# Patient Record
Sex: Female | Born: 1950 | Race: White | Hispanic: No | Marital: Married | Smoking: Former smoker
Health system: Southern US, Community
[De-identification: ages and names within clinical notes are randomized; demographics above are authoritative.]

## PROBLEM LIST (undated history)

## (undated) DIAGNOSIS — J449 Chronic obstructive pulmonary disease, unspecified: Secondary | ICD-10-CM

## (undated) DIAGNOSIS — G43909 Migraine, unspecified, not intractable, without status migrainosus: Secondary | ICD-10-CM

## (undated) DIAGNOSIS — F329 Major depressive disorder, single episode, unspecified: Secondary | ICD-10-CM

## (undated) DIAGNOSIS — T7840XA Allergy, unspecified, initial encounter: Secondary | ICD-10-CM

## (undated) DIAGNOSIS — G4730 Sleep apnea, unspecified: Secondary | ICD-10-CM

## (undated) DIAGNOSIS — M1990 Unspecified osteoarthritis, unspecified site: Secondary | ICD-10-CM

## (undated) DIAGNOSIS — F32A Depression, unspecified: Secondary | ICD-10-CM

## (undated) DIAGNOSIS — J189 Pneumonia, unspecified organism: Secondary | ICD-10-CM

## (undated) DIAGNOSIS — I10 Essential (primary) hypertension: Secondary | ICD-10-CM

## (undated) HISTORY — DX: Unspecified osteoarthritis, unspecified site: M19.90

## (undated) HISTORY — DX: Migraine, unspecified, not intractable, without status migrainosus: G43.909

## (undated) HISTORY — DX: Depression, unspecified: F32.A

## (undated) HISTORY — PX: DILATION AND CURETTAGE OF UTERUS: SHX78

## (undated) HISTORY — DX: Allergy, unspecified, initial encounter: T78.40XA

## (undated) HISTORY — DX: Sleep apnea, unspecified: G47.30

## (undated) HISTORY — DX: Major depressive disorder, single episode, unspecified: F32.9

## (undated) HISTORY — DX: Pneumonia, unspecified organism: J18.9

## (undated) HISTORY — PX: APPENDECTOMY: SHX54

## (undated) HISTORY — PX: ABDOMINAL HYSTERECTOMY: SHX81

## (undated) HISTORY — DX: Essential (primary) hypertension: I10

## (undated) HISTORY — DX: Chronic obstructive pulmonary disease, unspecified: J44.9

---

## 1973-06-06 HISTORY — PX: OVARY SURGERY: SHX727

## 2017-02-16 ENCOUNTER — Emergency Department (HOSPITAL_BASED_OUTPATIENT_CLINIC_OR_DEPARTMENT_OTHER)
Admission: EM | Admit: 2017-02-16 | Discharge: 2017-02-16 | Disposition: A | Discharge status: Home / Self Care | Attending: Emergency Medicine | Admitting: Emergency Medicine

## 2017-02-16 ENCOUNTER — Emergency Department (HOSPITAL_BASED_OUTPATIENT_CLINIC_OR_DEPARTMENT_OTHER)

## 2017-02-16 ENCOUNTER — Encounter (HOSPITAL_BASED_OUTPATIENT_CLINIC_OR_DEPARTMENT_OTHER): Payer: Self-pay

## 2017-02-16 DIAGNOSIS — Y999 Unspecified external cause status: Secondary | ICD-10-CM | POA: Insufficient documentation

## 2017-02-16 DIAGNOSIS — W228XXA Striking against or struck by other objects, initial encounter: Secondary | ICD-10-CM | POA: Insufficient documentation

## 2017-02-16 DIAGNOSIS — S6991XA Unspecified injury of right wrist, hand and finger(s), initial encounter: Secondary | ICD-10-CM | POA: Diagnosis present

## 2017-02-16 DIAGNOSIS — Y929 Unspecified place or not applicable: Secondary | ICD-10-CM | POA: Diagnosis not present

## 2017-02-16 DIAGNOSIS — Y939 Activity, unspecified: Secondary | ICD-10-CM | POA: Diagnosis not present

## 2017-02-16 DIAGNOSIS — S60221A Contusion of right hand, initial encounter: Secondary | ICD-10-CM | POA: Diagnosis not present

## 2017-02-16 NOTE — ED Triage Notes (Signed)
Pt reports she was putting mail in [place] yesterday and door came down and smashed her right hand; c/o 5/10 pain. Tetanus shot was 5 years ago.

## 2017-02-16 NOTE — ED Provider Notes (Signed)
[facility] Provider Note   CSN: [id number] Arrival date & time: 02/16/17  0854     History   Chief Complaint Chief Complaint  Patient presents with  . Hand Injury    right    HPI Shelly Howell is a 67 y.o. female. Chief complaint hand pain  HPI:  67 year old female. Worse for the [employer]. Had her hand on a  of a rolling shelf. The upper shelf slid down and struck her on the right side of her hand. Her hand was in a closed "gripping" position.  Has small break in the skin in the mid right second metacarpal soft tissue swelling and pain with movement. She presents here  History reviewed. No pertinent past medical history.  There are no active problems to display for this patient.   Past Surgical History:  Procedure Laterality Date  . ABDOMINAL HYSTERECTOMY    . ABDOMINAL SURGERY    . APPENDECTOMY    . DILATION AND CURETTAGE OF UTERUS      OB History    No data available       Home Medications    Prior to Admission medications   Not on File    Family History No family history on file.  Social History Social History   Tobacco Use  . Smoking status: Not on file  Substance Use Topics  . Alcohol use: Not on file  . Drug use: Not on file     Allergies   Patient has no known allergies.   Review of Systems Review of Systems  Constitutional: Negative for appetite change, chills, diaphoresis, fatigue and fever.  HENT: Negative for mouth sores, sore throat and trouble swallowing.   Eyes: Negative for visual disturbance.  Respiratory: Negative for cough, chest tightness, shortness of breath and wheezing.   Cardiovascular: Negative for chest pain.  Gastrointestinal: Negative for abdominal distention, abdominal pain, diarrhea, nausea and vomiting.  Endocrine: Negative for polydipsia, polyphagia and polyuria.  Genitourinary: Negative for dysuria, frequency and hematuria.  Musculoskeletal: Positive for arthralgias.  Negative for gait problem.       Right hand pain and swelling.  Skin: Negative for color change, pallor and rash.  Neurological: Negative for dizziness, syncope, light-headedness and headaches.  Hematological: Does not bruise/bleed easily.  Psychiatric/Behavioral: Negative for behavioral problems and confusion.     Physical Exam Updated Vital Signs BP (!) 167/82 (BP Location: Right Arm)   Pulse 72   Temp 97.9 F (36.6 C) (Oral)   Resp 16   Ht 5\' 3"  (1.6 m)   Wt 95.3 kg (210 lb)   SpO2 96%   BMI 37.20 kg/m   Physical Exam  Musculoskeletal:  Tenderness in the mid right second metacarpal with some surrounding ecchymosis and soft tissue swelling per she can make a fist although it is painful. Range of motion of the thumb as well as her middle, ring, and small finger. Small break in the skin but no full-thickness laceration noted. Normal feeling and capillary refill distally     ED Treatments / Results  Labs (all labs ordered are listed, but only abnormal results are displayed) Labs Reviewed - No data to display  EKG  EKG Interpretation None       Radiology Dg Hand Complete Right  Result Date: 02/16/2017 CLINICAL DATA:  Hand injury EXAM: RIGHT HAND - COMPLETE 3+ VIEW COMPARISON:  None. FINDINGS: There is no evidence of fracture or dislocation. There is no evidence of arthropathy or other  focal bone abnormality. Soft tissues are unremarkable. IMPRESSION: Negative. Electronically Signed   By: Franchot Gallo M.D.   On: 02/16/2017 09:53    Procedures Procedures (including critical care time)  Medications Ordered in ED Medications - No data to display   Initial Impression / Assessment and Plan / ED Course  I have reviewed the triage vital signs and the nursing notes.  Pertinent labs & imaging results that were available during my care of the patient were reviewed by me and considered in my medical decision making (see chart for details).   x-rays negative. A strep  applied. 72 hours of minimizing weight bearing. Occupational health through [facility] if not improving.  Final Clinical Impressions(s) / ED Diagnoses   Final diagnoses:  Contusion of right hand, initial encounter    ED Discharge Orders    None       Tanna Furry, MD 02/16/17 1030

## 2017-02-16 NOTE — Discharge Instructions (Signed)
Ace wrap to limit motion, and swelling. Apply ice as needed. Take Motrin or Tylenol for pain. Follow-up with your employer's occupational health if continued symptoms after 72 hours.

## 2017-05-29 DIAGNOSIS — J219 Acute bronchiolitis, unspecified: Secondary | ICD-10-CM | POA: Diagnosis not present

## 2017-08-31 ENCOUNTER — Ambulatory Visit (INDEPENDENT_AMBULATORY_CARE_PROVIDER_SITE_OTHER): Payer: Federal, State, Local not specified - PPO | Admitting: Family Medicine

## 2017-08-31 ENCOUNTER — Encounter: Payer: Self-pay | Admitting: Family Medicine

## 2017-08-31 VITALS — BP 128/78 | HR 57 | Temp 98.6°F | Ht 64.0 in | Wt 215.1 lb

## 2017-08-31 DIAGNOSIS — G8929 Other chronic pain: Secondary | ICD-10-CM | POA: Diagnosis not present

## 2017-08-31 DIAGNOSIS — R053 Chronic cough: Secondary | ICD-10-CM | POA: Insufficient documentation

## 2017-08-31 DIAGNOSIS — M545 Low back pain, unspecified: Secondary | ICD-10-CM

## 2017-08-31 DIAGNOSIS — Z1239 Encounter for other screening for malignant neoplasm of breast: Secondary | ICD-10-CM

## 2017-08-31 DIAGNOSIS — Z23 Encounter for immunization: Secondary | ICD-10-CM

## 2017-08-31 DIAGNOSIS — R05 Cough: Secondary | ICD-10-CM | POA: Diagnosis not present

## 2017-08-31 DIAGNOSIS — Z1231 Encounter for screening mammogram for malignant neoplasm of breast: Secondary | ICD-10-CM

## 2017-08-31 MED ORDER — ALBUTEROL SULFATE 108 (90 BASE) MCG/ACT IN AEPB: 1.0000 | INHALATION_SPRAY | Freq: Four times a day (QID) | RESPIRATORY_TRACT | 1 refills | Status: DC | PRN

## 2017-08-31 NOTE — Patient Instructions (Signed)
Heat (pad or rice pillow in microwave) over affected area, 10-15 minutes twice daily.   Try the inhaler around 10-15 minutes before you know you are going to be exposed to things or right when you start having issues.  EXERCISES  RANGE OF MOTION (ROM) AND STRETCHING EXERCISES - Low Back Pain Most people with lower back pain will find that their symptoms get worse with excessive bending forward (flexion) or arching at the lower back (extension). The exercises that will help resolve your symptoms will focus on the opposite motion.  If you have pain, numbness or tingling which travels down into your buttocks, leg or foot, the goal of the therapy is for these symptoms to move closer to your back and eventually resolve. Sometimes, these leg symptoms will get better, but your lower back pain may worsen. This is often an indication of progress in your rehabilitation. Be very alert to any changes in your symptoms and the activities in which you participated in the 24 hours prior to the change. Sharing this information with your caregiver will allow him or her to most efficiently treat your condition. These exercises may help you when beginning to rehabilitate your injury. Your symptoms may resolve with or without further involvement from your physician, physical therapist or athletic trainer. While completing these exercises, remember:   Restoring tissue flexibility helps normal motion to return to the joints. This allows healthier, less painful movement and activity.  An effective stretch should be held for at least 30 seconds.  A stretch should never be painful. You should only feel a gentle lengthening or release in the stretched tissue. FLEXION RANGE OF MOTION AND STRETCHING EXERCISES:  STRETCH - Flexion, Single Knee to Chest   Lie on a firm bed or floor with both legs extended in front of you.  Keeping one leg in contact with the floor, bring your opposite knee to your chest. Hold your leg in place  by either grabbing behind your thigh or at your knee.  Pull until you feel a gentle stretch in your low back. Hold 30 seconds.  Slowly release your grasp and repeat the exercise with the opposite side. Repeat 2 times. Complete this exercise 3 times per week.   STRETCH - Flexion, Double Knee to Chest  Lie on a firm bed or floor with both legs extended in front of you.  Keeping one leg in contact with the floor, bring your opposite knee to your chest.  Tense your stomach muscles to support your back and then lift your other knee to your chest. Hold your legs in place by either grabbing behind your thighs or at your knees.  Pull both knees toward your chest until you feel a gentle stretch in your low back. Hold 30 seconds.  Tense your stomach muscles and slowly return one leg at a time to the floor. Repeat 2 times. Complete this exercise 3 times per week.   STRETCH - Low Trunk Rotation  Lie on a firm bed or floor. Keeping your legs in front of you, bend your knees so they are both pointed toward the ceiling and your feet are flat on the floor.  Extend your arms out to the side. This will stabilize your upper body by keeping your shoulders in contact with the floor.  Gently and slowly drop both knees together to one side until you feel a gentle stretch in your low back. Hold for 30 seconds.  Tense your stomach muscles to support your lower back  as you bring your knees back to the starting position. Repeat the exercise to the other side. Repeat 2 times. Complete this exercise at least 3 times per week.   EXTENSION RANGE OF MOTION AND FLEXIBILITY EXERCISES:  STRETCH - Extension, Prone on Elbows   Lie on your stomach on the floor, a bed will be too soft. Place your palms about shoulder width apart and at the height of your head.  Place your elbows under your shoulders. If this is too painful, stack pillows under your chest.  Allow your body to relax so that your hips drop lower and  make contact more completely with the floor.  Hold this position for 30 seconds.  Slowly return to lying flat on the floor. Repeat 2 times. Complete this exercise 3 times per week.   RANGE OF MOTION - Extension, Prone Press Ups  Lie on your stomach on the floor, a bed will be too soft. Place your palms about shoulder width apart and at the height of your head.  Keeping your back as relaxed as possible, slowly straighten your elbows while keeping your hips on the floor. You may adjust the placement of your hands to maximize your comfort. As you gain motion, your hands will come more underneath your shoulders.  Hold this position 30 seconds.  Slowly return to lying flat on the floor. Repeat 2 times. Complete this exercise 3 times per week.   RANGE OF MOTION- Quadruped, Neutral Spine   Assume a hands and knees position on a firm surface. Keep your hands under your shoulders and your knees under your hips. You may place padding under your knees for comfort.  Drop your head and point your tailbone toward the ground below you. This will round out your lower back like an angry cat. Hold this position for 30 seconds.  Slowly lift your head and release your tail bone so that your back sags into a large arch, like an old horse.  Hold this position for 30 seconds.  Repeat this until you feel limber in your low back.  Now, find your "sweet spot." This will be the most comfortable position somewhere between the two previous positions. This is your neutral spine. Once you have found this position, tense your stomach muscles to support your low back.  Hold this position for 30 seconds. Repeat 2 times. Complete this exercise 3 times per week.   STRENGTHENING EXERCISES - Low Back Sprain These exercises may help you when beginning to rehabilitate your injury. These exercises should be done near your "sweet spot." This is the neutral, low-back arch, somewhere between fully rounded and fully arched,  that is your least painful position. When performed in this safe range of motion, these exercises can be used for people who have either a flexion or extension based injury. These exercises may resolve your symptoms with or without further involvement from your physician, physical therapist or athletic trainer. While completing these exercises, remember:   Muscles can gain both the endurance and the strength needed for everyday activities through controlled exercises.  Complete these exercises as instructed by your physician, physical therapist or athletic trainer. Increase the resistance and repetitions only as guided.  You may experience muscle soreness or fatigue, but the pain or discomfort you are trying to eliminate should never worsen during these exercises. If this pain does worsen, stop and make certain you are following the directions exactly. If the pain is still present after adjustments, discontinue the exercise until you  can discuss the trouble with your caregiver.  STRENGTHENING - Deep Abdominals, Pelvic Tilt   Lie on a firm bed or floor. Keeping your legs in front of you, bend your knees so they are both pointed toward the ceiling and your feet are flat on the floor.  Tense your lower abdominal muscles to press your low back into the floor. This motion will rotate your pelvis so that your tail bone is scooping upwards rather than pointing at your feet or into the floor. With a gentle tension and even breathing, hold this position for 3 seconds. Repeat 2 times. Complete this exercise 3 times per week.   STRENGTHENING - Abdominals, Crunches   Lie on a firm bed or floor. Keeping your legs in front of you, bend your knees so they are both pointed toward the ceiling and your feet are flat on the floor. Cross your arms over your chest.  Slightly tip your chin down without bending your neck.  Tense your abdominals and slowly lift your trunk high enough to just clear your shoulder  blades. Lifting higher can put excessive stress on the lower back and does not further strengthen your abdominal muscles.  Control your return to the starting position. Repeat 2 times. Complete this exercise 3 times per week.   STRENGTHENING - Quadruped, Opposite UE/LE Lift   Assume a hands and knees position on a firm surface. Keep your hands under your shoulders and your knees under your hips. You may place padding under your knees for comfort.  Find your neutral spine and gently tense your abdominal muscles so that you can maintain this position. Your shoulders and hips should form a rectangle that is parallel with the floor and is not twisted.  Keeping your trunk steady, lift your right hand no higher than your shoulder and then your left leg no higher than your hip. Make sure you are not holding your breath. Hold this position for 30 seconds.  Continuing to keep your abdominal muscles tense and your back steady, slowly return to your starting position. Repeat with the opposite arm and leg. Repeat 2 times. Complete this exercise 3 times per week.   STRENGTHENING - Abdominals and Quadriceps, Straight Leg Raise   Lie on a firm bed or floor with both legs extended in front of you.  Keeping one leg in contact with the floor, bend the other knee so that your foot can rest flat on the floor.  Find your neutral spine, and tense your abdominal muscles to maintain your spinal position throughout the exercise.  Slowly lift your straight leg off the floor about 6 inches for a count of 3, making sure to not hold your breath.  Still keeping your neutral spine, slowly lower your leg all the way to the floor. Repeat this exercise with each leg 2 times. Complete this exercise 3 times per week.  POSTURE AND BODY MECHANICS CONSIDERATIONS - Low Back Sprain Keeping correct posture when sitting, standing or completing your activities will reduce the stress put on different body tissues, allowing injured  tissues a chance to heal and limiting painful experiences. The following are general guidelines for improved posture.  While reading these guidelines, remember:  The exercises prescribed by your provider will help you have the flexibility and strength to maintain correct postures.  The correct posture provides the best environment for your joints to work. All of your joints have less wear and tear when properly supported by a spine with good posture. This  means you will experience a healthier, less painful body.  Correct posture must be practiced with all of your activities, especially prolonged sitting and standing. Correct posture is as important when doing repetitive low-stress activities (typing) as it is when doing a single heavy-load activity (lifting).  RESTING POSITIONS Consider which positions are most painful for you when choosing a resting position. If you have pain with flexion-based activities (sitting, bending, stooping, squatting), choose a position that allows you to rest in a less flexed posture. You would want to avoid curling into a fetal position on your side. If your pain worsens with extension-based activities (prolonged standing, working overhead), avoid resting in an extended position such as sleeping on your stomach. Most people will find more comfort when they rest with their spine in a more neutral position, neither too rounded nor too arched. Lying on a non-sagging bed on your side with a pillow between your knees, or on your back with a pillow under your knees will often provide some relief. Keep in mind, being in any one position for a prolonged period of time, no matter how correct your posture, can still lead to stiffness.  PROPER SITTING POSTURE In order to minimize stress and discomfort on your spine, you must sit with correct posture. Sitting with good posture should be effortless for a healthy body. Returning to good posture is a gradual process. Many people can work  toward this most comfortably by using various supports until they have the flexibility and strength to maintain this posture on their own. When sitting with proper posture, your ears will fall over your shoulders and your shoulders will fall over your hips. You should use the back of the chair to support your upper back. Your lower back will be in a neutral position, just slightly arched. You may place a small pillow or folded towel at the base of your lower back for  support.  When working at a desk, create an environment that supports good, upright posture. Without extra support, muscles tire, which leads to excessive strain on joints and other tissues. Keep these recommendations in mind:  CHAIR:  A chair should be able to slide under your desk when your back makes contact with the back of the chair. This allows you to work closely.  The chair's height should allow your eyes to be level with the upper part of your monitor and your hands to be slightly lower than your elbows.  BODY POSITION  Your feet should make contact with the floor. If this is not possible, use a foot rest.  Keep your ears over your shoulders. This will reduce stress on your neck and low back.  INCORRECT SITTING POSTURES  If you are feeling tired and unable to assume a healthy sitting posture, do not slouch or slump. This puts excessive strain on your back tissues, causing more damage and pain. Healthier options include:  Using more support, like a lumbar pillow.  Switching tasks to something that requires you to be upright or walking.  Talking a brief walk.  Lying down to rest in a neutral-spine position.  PROLONGED STANDING WHILE SLIGHTLY LEANING FORWARD  When completing a task that requires you to lean forward while standing in one place for a long time, place either foot up on a stationary 2-4 inch high object to help maintain the best posture. When both feet are on the ground, the lower back tends to lose its  slight inward curve. If this curve flattens (or  becomes too large), then the back and your other joints will experience too much stress, tire more quickly, and can cause pain.  CORRECT STANDING POSTURES Proper standing posture should be assumed with all daily activities, even if they only take a few moments, like when brushing your teeth. As in sitting, your ears should fall over your shoulders and your shoulders should fall over your hips. You should keep a slight tension in your abdominal muscles to brace your spine. Your tailbone should point down to the ground, not behind your body, resulting in an over-extended swayback posture.   INCORRECT STANDING POSTURES  Common incorrect standing postures include a forward head, locked knees and/or an excessive swayback. WALKING Walk with an upright posture. Your ears, shoulders and hips should all line-up.  PROLONGED ACTIVITY IN A FLEXED POSITION When completing a task that requires you to bend forward at your waist or lean over a low surface, try to find a way to stabilize 3 out of 4 of your limbs. You can place a hand or elbow on your thigh or rest a knee on the surface you are reaching across. This will provide you more stability, so that your muscles do not tire as quickly. By keeping your knees relaxed, or slightly bent, you will also reduce stress across your lower back. CORRECT LIFTING TECHNIQUES  DO :  Assume a wide stance. This will provide you more stability and the opportunity to get as close as possible to the object which you are lifting.  Tense your abdominals to brace your spine. Bend at the knees and hips. Keeping your back locked in a neutral-spine position, lift using your leg muscles. Lift with your legs, keeping your back straight.  Test the weight of unknown objects before attempting to lift them.  Try to keep your elbows locked down at your sides in order get the best strength from your shoulders when carrying an  object.     Always ask for help when lifting heavy or awkward objects. INCORRECT LIFTING TECHNIQUES DO NOT:   Lock your knees when lifting, even if it is a small object.  Bend and twist. Pivot at your feet or move your feet when needing to change directions.  Assume that you can safely pick up even a paperclip without proper posture.

## 2017-08-31 NOTE — Progress Notes (Signed)
Chief Complaint  Patient presents with  . New Patient (Initial Visit)       New Patient Visit SUBJECTIVE: HPI: Shelly Howell is an 67 y.o.female who is being seen for establishing care.  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 While lifting at work, she would have freq exacerbations. Over the past 2.5 weeks, has gotten much better with a new role at work. She is retiring next [employer]. Had seen a chiropractor which did help. No numbness, tingling, weakness, or loss of bowel/bladder function.  Has had a longstanding dry cough. Was told to breathe deeply in the past. Seems to get worse with exposure to the furnace or perfumes/vaping at work. Has never trialed an inhaler. No hx of asthma. Denies current sob or cp.    No Known Allergies   Past Medical History:  Diagnosis Date  . Depression   . Frequent headaches   . Migraines      Past Surgical History:  Procedure Laterality Date  . ABDOMINAL HYSTERECTOMY    . ABDOMINAL SURGERY    . APPENDECTOMY    . DILATION AND CURETTAGE OF UTERUS     Family History  Problem Relation Age of Onset  . Cancer Mother   . Alcohol abuse Father   . COPD Father   . Diabetes Father   . Cancer Sister   . Drug abuse Sister   . Heart disease Sister   . Cancer Brother   . Stroke Maternal Grandmother   . Cancer Maternal Grandfather    No Known Allergies  Current Outpatient Medications:  .  Cholecalciferol (VITAMIN D) 2000 units CAPS, Take by mouth., Disp: , Rfl:  .  glucosamine-chondroitin 500-400 MG tablet, Take 1 tablet by mouth 3 (three) times daily., Disp: , Rfl:  .  Multiple Vitamin (MULTIVITAMIN) tablet, Take 1 tablet by mouth daily., Disp: , Rfl:  .  Omega-3 Fatty Acids (FISH OIL PO), Take by mouth., Disp: , Rfl:  .  Albuterol Sulfate 108 (90 Base) MCG/ACT AEPB, Inhale 1-2 puffs into the lungs every 6 (six) hours as needed (Cough)., Disp: 1 each, Rfl: 1  ROS Cardiovascular: Denies chest pain   Respiratory: Denies dyspnea   OBJECTIVE: BP 128/78 (BP Location: Left Arm, Patient Position: Sitting, Cuff Size: Large)   Pulse (!) 57   Temp 98.6 F (37 C) (Oral)   Ht 5\' 4"  (1.626 m)   Wt 215 lb 2 oz (97.6 kg)   SpO2 97%   BMI 36.93 kg/m   Constitutional: -  VS reviewed -  Well developed, well nourished, appears stated age -  No apparent distress  Psychiatric: -  Oriented to person, place, and time -  Memory intact -  Affect and mood normal -  Fluent conversation, good eye contact -  Judgment and insight age appropriate  Eye: -  Conjunctivae clear, no discharge -  Pupils symmetric, round, reactive to light  ENMT: -  MMM    Pharynx moist, no exudate, no erythema  Neck: -  No gross swelling, no palpable masses -  Thyroid midline, not enlarged, mobile, no palpable masses  Cardiovascular: -  RRR -  No LE edema  Respiratory: -  Normal respiratory effort, no accessory muscle use, no retraction -  Breath sounds equal, no wheezes, no ronchi, no crackles  Gastrointestinal: -  Bowel sounds normal -  No tenderness, no distention, no guarding, no masses  Neurological:  -  CN II - XII  grossly intact -  Neg straight leg  Musculoskeletal: -  No clubbing, no cyanosis -  Gait normal -  Mild ttp to L lumbar paraspinal msc -  Tight hamstrings b/l, worse on L  Skin: -  No significant lesion on inspection -  Warm and dry to palpation   ASSESSMENT/PLAN: Chronic left-sided low back pain without sciatica  Chronic cough - Plan: Albuterol Sulfate 108 (90 Base) MCG/ACT AEPB  Screening for breast cancer - Plan: MM 3D SCREEN BREAST BILATERAL, CANCELED: MM DIGITAL SCREENING BILATERAL  Need for vaccination against Streptococcus pneumoniae - Plan: Pneumococcal conjugate vaccine 13-valent IM  Patient instructed to sign release of records form from her previous PCP. #1- stretches/exercises, heat, activity as tolerated, she is improving #2- trial SABA. Could consider reflux but might be a  cough variant astham Immunize, will get Shingrix once she retires. Patient should return in 1 mo for CPE. The patient voiced understanding and agreement to the plan.   [place], DO 08/31/17  8:17 AM

## 2017-08-31 NOTE — Progress Notes (Signed)
Pre visit review using our clinic review tool, if applicable. No additional management support is needed unless otherwise documented below in the visit note. 

## 2017-09-06 ENCOUNTER — Ambulatory Visit (HOSPITAL_BASED_OUTPATIENT_CLINIC_OR_DEPARTMENT_OTHER)
Admission: RE | Admit: 2017-09-06 | Discharge: 2017-09-06 | Disposition: A | Discharge status: Home / Self Care | Payer: Federal, State, Local not specified - PPO | Source: Ambulatory Visit | Attending: Family Medicine | Admitting: Family Medicine

## 2017-09-06 DIAGNOSIS — Z1239 Encounter for other screening for malignant neoplasm of breast: Secondary | ICD-10-CM

## 2017-09-06 DIAGNOSIS — Z1231 Encounter for screening mammogram for malignant neoplasm of breast: Secondary | ICD-10-CM | POA: Insufficient documentation

## 2017-09-07 ENCOUNTER — Other Ambulatory Visit: Payer: Self-pay | Admitting: Family Medicine

## 2017-09-07 DIAGNOSIS — R928 Other abnormal and inconclusive findings on diagnostic imaging of breast: Secondary | ICD-10-CM

## 2017-09-10 ENCOUNTER — Telehealth: Payer: Self-pay | Admitting: *Deleted

## 2017-09-10 NOTE — Telephone Encounter (Signed)
Received Physician Orders from [place]; forwarded to provider/SLS 08/05

## 2017-09-12 ENCOUNTER — Ambulatory Visit
Admission: RE | Admit: 2017-09-12 | Discharge: 2017-09-12 | Disposition: A | Discharge status: Home / Self Care | Payer: Federal, State, Local not specified - PPO | Source: Ambulatory Visit | Attending: Family Medicine | Admitting: Family Medicine

## 2017-09-12 DIAGNOSIS — N6002 Solitary cyst of left breast: Secondary | ICD-10-CM | POA: Diagnosis not present

## 2017-09-12 DIAGNOSIS — R928 Other abnormal and inconclusive findings on diagnostic imaging of breast: Secondary | ICD-10-CM

## 2017-10-01 ENCOUNTER — Ambulatory Visit (INDEPENDENT_AMBULATORY_CARE_PROVIDER_SITE_OTHER): Payer: Federal, State, Local not specified - PPO | Admitting: Family Medicine

## 2017-10-01 ENCOUNTER — Encounter: Payer: Self-pay | Admitting: Gastroenterology

## 2017-10-01 ENCOUNTER — Encounter: Payer: Self-pay | Admitting: Family Medicine

## 2017-10-01 VITALS — BP 122/80 | HR 59 | Temp 97.7°F | Ht 64.0 in | Wt 209.4 lb

## 2017-10-01 DIAGNOSIS — Z114 Encounter for screening for human immunodeficiency virus [HIV]: Secondary | ICD-10-CM

## 2017-10-01 DIAGNOSIS — Z1159 Encounter for screening for other viral diseases: Secondary | ICD-10-CM

## 2017-10-01 DIAGNOSIS — Z1211 Encounter for screening for malignant neoplasm of colon: Secondary | ICD-10-CM | POA: Diagnosis not present

## 2017-10-01 DIAGNOSIS — Z23 Encounter for immunization: Secondary | ICD-10-CM

## 2017-10-01 DIAGNOSIS — Z0000 Encounter for general adult medical examination without abnormal findings: Secondary | ICD-10-CM | POA: Diagnosis not present

## 2017-10-01 DIAGNOSIS — E2839 Other primary ovarian failure: Secondary | ICD-10-CM

## 2017-10-01 LAB — COMPREHENSIVE METABOLIC PANEL
ALBUMIN: 4.3 g/dL (ref 3.5–5.2)
ALT: 18 U/L (ref 0–35)
AST: 15 U/L (ref 0–37)
Alkaline Phosphatase: 61 U/L (ref 39–117)
BUN: 20 mg/dL (ref 6–23)
CHLORIDE: 104 meq/L (ref 96–112)
CO2: 28 mEq/L (ref 19–32)
Calcium: 10.9 mg/dL — ABNORMAL HIGH (ref 8.4–10.5)
Creatinine, Ser: 0.81 mg/dL (ref 0.40–1.20)
GFR: 75.37 mL/min (ref >60.00)
GLUCOSE: 95 mg/dL (ref 70–99)
POTASSIUM: 4.5 meq/L (ref 3.5–5.1)
SODIUM: 137 meq/L (ref 135–145)
Total Bilirubin: 0.8 mg/dL (ref 0.2–1.2)
Total Protein: 7.3 g/dL (ref 6.0–8.3)

## 2017-10-01 LAB — CBC
HEMATOCRIT: 38.2 % (ref 36.0–46.0)
Hemoglobin: 12.6 g/dL (ref 12.0–15.0)
MCHC: 33 g/dL (ref 30.0–36.0)
MCV: 88.7 fl (ref 78.0–100.0)
Platelets: 232 10*3/uL (ref 150.0–400.0)
RBC: 4.31 Mil/uL (ref 3.87–5.11)
RDW: 13.9 % (ref 11.5–15.5)
WBC: 4.3 10*3/uL (ref 4.0–10.5)

## 2017-10-01 LAB — LIPID PANEL
Cholesterol: 217 mg/dL — ABNORMAL HIGH (ref 0–200)
HDL: 60.4 mg/dL (ref >39.00)
LDL CALC: 139 mg/dL — AB (ref 0–99)
NonHDL: 156.17
Total CHOL/HDL Ratio: 4
Triglycerides: 85 mg/dL (ref 0.0–149.0)
VLDL: 17 mg/dL (ref 0.0–40.0)

## 2017-10-01 NOTE — Progress Notes (Signed)
Pre visit review using our clinic review tool, if applicable. No additional management support is needed unless otherwise documented below in the visit note. 

## 2017-10-01 NOTE — Progress Notes (Signed)
Chief Complaint  Patient presents with  . Annual Exam     Well Woman Shelly Howell is here for a complete physical.   Her last physical was >1 year ago.  Current diet: in general, a "healthy" diet. Sleeping well. Recently retired. Weight is stable and shedenies daytime fatigue. No LMP recorded. Patient has had a hysterectomy. Seatbelt? Yes  Health Maintenance Colonoscopy- No  Pap Smear- 2007- was told  Shingrix- No DEXA- No Mammogram- Yes Tetanus- Yes - 01/2012 Pneumonia- Yes  Hep C screen- No  Past Medical History:  Diagnosis Date  . Depression   . Frequent headaches   . Migraines      Past Surgical History:  Procedure Laterality Date  . ABDOMINAL HYSTERECTOMY    . APPENDECTOMY    . DILATION AND CURETTAGE OF UTERUS      Medications  Current Outpatient Medications on File Prior to Visit  Medication Sig Dispense Refill  . Albuterol Sulfate 108 (90 Base) MCG/ACT AEPB Inhale 1-2 puffs into the lungs every 6 (six) hours as needed (Cough). 1 each 1  . Cholecalciferol (VITAMIN D) 2000 units CAPS Take by mouth.    Marland Kitchen glucosamine-chondroitin 500-400 MG tablet Take 1 tablet by mouth 3 (three) times daily.    . Multiple Vitamin (MULTIVITAMIN) tablet Take 1 tablet by mouth daily.    . Omega-3 Fatty Acids (FISH OIL PO) Take by mouth.     Allergies No Known Allergies  Review of Systems: Constitutional:  no sweats Eye:  no recent significant change in vision Ear/Nose/Mouth/Throat:  Ears:  No changes in hearing Nose/Mouth/Throat:  no complaints of nasal congestion, no sore throat Cardiovascular: no chest pain Respiratory:  No cough and no shortness of breath Gastrointestinal:  no abdominal pain, no change in bowel habits GU:  Female: negative for dysuria or pelvic pain Musculoskeletal/Extremities:  no pain of the joints Integumentary (Skin/Breast):  no abnormal skin lesions reported Neurologic:  no headaches Psychiatric:  no anxiety, no depression Endocrine:  denies  unexplained weight changes Hematologic/Lymphatic:  no abnormal bleeding  Exam BP 122/80 (BP Location: Left Arm, Patient Position: Sitting, Cuff Size: Large)   Pulse (!) 59   Temp 97.7 F (36.5 C) (Oral)   Ht 5\' 4"  (1.626 m)   Wt 209 lb 6 oz (95 kg)   SpO2 97%   BMI 35.94 kg/m  General:  well developed, well nourished, in no apparent distress Skin:  no significant moles, warts, or growths Head:  no masses, lesions, or tenderness Eyes:  pupils equal and round, sclera anicteric without injection Ears:  canals without lesions, TMs shiny without retraction, no obvious effusion, no erythema Nose:  nares patent, septum midline, mucosa normal, and no drainage or sinus tenderness Throat/Pharynx:  lips and gingiva without lesion; tongue and uvula midline; non-inflamed pharynx; no exudates or postnasal drainage Neck: neck supple without adenopathy, thyromegaly, or masses Lungs:  clear to auscultation, breath sounds equal bilaterally, no respiratory distress Cardio:  regular rate and rhythm, no bruits or LE edema Abdomen:  abdomen soft, nontender; bowel sounds normal; no masses or organomegaly Genital: Deferred Musculoskeletal:  symmetrical muscle groups noted without atrophy or deformity Extremities:  no clubbing, cyanosis, or edema, no deformities, no skin discoloration Neuro:  gait normal; deep tendon reflexes normal and symmetric Psych: well oriented with normal range of affect and appropriate judgment/insight  Assessment and Plan  Well adult exam - Plan: CBC, Comprehensive metabolic panel, Lipid panel  Screening for HIV (human immunodeficiency virus) - Plan: HIV  antibody  Encounter for hepatitis C screening test for low risk patient - Plan: Hepatitis C antibody  Screen for colon cancer - Plan: DG Bone Density  Estrogen deficiency - Plan: Ambulatory referral to Gastroenterology   Well 67 y.o. female. Counseled on diet and exercise. Info for GYN office across hall given. Other  orders as above. Follow up in 1 year pending the above workup. The patient voiced understanding and agreement to the plan.  [place], DO 10/01/17 8:36 AM

## 2017-10-01 NOTE — Addendum Note (Signed)
Addended by: Sharon Seller B on: 10/01/2017 08:46 AM   Modules accepted: Orders

## 2017-10-01 NOTE — Patient Instructions (Addendum)
Call Center for [place] at [facility] at [phone] for an appointment.  They are located at [address], [address], [place], [place], [address] (right across the hall from our office).  Give [place] 2-3 business days to get the results of your labs back.   OK to use Debrox (peroxide) in the ear to loosen up wax. Also recommend using a bulb syringe (for removing boogers from baby's noses) to flush through warm water. Do not use Q-tips as this can impact wax further.   EXERCISES  RANGE OF MOTION (ROM) AND STRETCHING EXERCISES - Low Back Pain Most people with lower back pain will find that their symptoms get worse with excessive bending forward (flexion) or arching at the lower back (extension). The exercises that will help resolve your symptoms will focus on the opposite motion.  If you have pain, numbness or tingling which travels down into your buttocks, leg or foot, the goal of the therapy is for these symptoms to move closer to your back and eventually resolve. Sometimes, these leg symptoms will get better, but your lower back pain may worsen. This is often an indication of progress in your rehabilitation. Be very alert to any changes in your symptoms and the activities in which you participated in the 24 hours prior to the change. Sharing this information with your caregiver will allow him or her to most efficiently treat your condition. These exercises may help you when beginning to rehabilitate your injury. Your symptoms may resolve with or without further involvement from your physician, physical therapist or athletic trainer. While completing these exercises, remember:   Restoring tissue flexibility helps normal motion to return to the joints. This allows healthier, less painful movement and activity.  An effective stretch should be held for at least 30 seconds.  A stretch should never be painful. You should only feel a gentle lengthening or release in the stretched  tissue. FLEXION RANGE OF MOTION AND STRETCHING EXERCISES:  STRETCH - Flexion, Single Knee to Chest   Lie on a firm bed or floor with both legs extended in front of you.  Keeping one leg in contact with the floor, bring your opposite knee to your chest. Hold your leg in place by either grabbing behind your thigh or at your knee.  Pull until you feel a gentle stretch in your low back. Hold 30 seconds.  Slowly release your grasp and repeat the exercise with the opposite side. Repeat 2 times. Complete this exercise 3 times per week.   STRETCH - Flexion, Double Knee to Chest  Lie on a firm bed or floor with both legs extended in front of you.  Keeping one leg in contact with the floor, bring your opposite knee to your chest.  Tense your stomach muscles to support your back and then lift your other knee to your chest. Hold your legs in place by either grabbing behind your thighs or at your knees.  Pull both knees toward your chest until you feel a gentle stretch in your low back. Hold 30 seconds.  Tense your stomach muscles and slowly return one leg at a time to the floor. Repeat 2 times. Complete this exercise 3 times per week.   STRETCH - Low Trunk Rotation  Lie on a firm bed or floor. Keeping your legs in front of you, bend your knees so they are both pointed toward the ceiling and your feet are flat on the floor.  Extend your arms out to  the side. This will stabilize your upper body by keeping your shoulders in contact with the floor.  Gently and slowly drop both knees together to one side until you feel a gentle stretch in your low back. Hold for 30 seconds.  Tense your stomach muscles to support your lower back as you bring your knees back to the starting position. Repeat the exercise to the other side. Repeat 2 times. Complete this exercise at least 3 times per week.   EXTENSION RANGE OF MOTION AND FLEXIBILITY EXERCISES:  STRETCH - Extension, Prone on Elbows   Lie on your  stomach on the floor, a bed will be too soft. Place your palms about shoulder width apart and at the height of your head.  Place your elbows under your shoulders. If this is too painful, stack pillows under your chest.  Allow your body to relax so that your hips drop lower and make contact more completely with the floor.  Hold this position for 30 seconds.  Slowly return to lying flat on the floor. Repeat 2 times. Complete this exercise 3 times per week.   RANGE OF MOTION - Extension, Prone Press Ups  Lie on your stomach on the floor, a bed will be too soft. Place your palms about shoulder width apart and at the height of your head.  Keeping your back as relaxed as possible, slowly straighten your elbows while keeping your hips on the floor. You may adjust the placement of your hands to maximize your comfort. As you gain motion, your hands will come more underneath your shoulders.  Hold this position 30 seconds.  Slowly return to lying flat on the floor. Repeat 2 times. Complete this exercise 3 times per week.   RANGE OF MOTION- Quadruped, Neutral Spine   Assume a hands and knees position on a firm surface. Keep your hands under your shoulders and your knees under your hips. You may place padding under your knees for comfort.  Drop your head and point your tailbone toward the ground below you. This will round out your lower back like an angry cat. Hold this position for 30 seconds.  Slowly lift your head and release your tail bone so that your back sags into a large arch, like an old horse.  Hold this position for 30 seconds.  Repeat this until you feel limber in your low back.  Now, find your "sweet spot." This will be the most comfortable position somewhere between the two previous positions. This is your neutral spine. Once you have found this position, tense your stomach muscles to support your low back.  Hold this position for 30 seconds. Repeat 2 times. Complete this  exercise 3 times per week.   STRENGTHENING EXERCISES - Low Back Sprain These exercises may help you when beginning to rehabilitate your injury. These exercises should be done near your "sweet spot." This is the neutral, low-back arch, somewhere between fully rounded and fully arched, that is your least painful position. When performed in this safe range of motion, these exercises can be used for people who have either a flexion or extension based injury. These exercises may resolve your symptoms with or without further involvement from your physician, physical therapist or athletic trainer. While completing these exercises, remember:   Muscles can gain both the endurance and the strength needed for everyday activities through controlled exercises.  Complete these exercises as instructed by your physician, physical therapist or athletic trainer. Increase the resistance and repetitions only as guided.  You may experience muscle soreness or fatigue, but the pain or discomfort you are trying to eliminate should never worsen during these exercises. If this pain does worsen, stop and make certain you are following the directions exactly. If the pain is still present after adjustments, discontinue the exercise until you can discuss the trouble with your caregiver.  STRENGTHENING - Deep Abdominals, Pelvic Tilt   Lie on a firm bed or floor. Keeping your legs in front of you, bend your knees so they are both pointed toward the ceiling and your feet are flat on the floor.  Tense your lower abdominal muscles to press your low back into the floor. This motion will rotate your pelvis so that your tail bone is scooping upwards rather than pointing at your feet or into the floor. With a gentle tension and even breathing, hold this position for 3 seconds. Repeat 2 times. Complete this exercise 3 times per week.   STRENGTHENING - Abdominals, Crunches   Lie on a firm bed or floor. Keeping your legs in front of  you, bend your knees so they are both pointed toward the ceiling and your feet are flat on the floor. Cross your arms over your chest.  Slightly tip your chin down without bending your neck.  Tense your abdominals and slowly lift your trunk high enough to just clear your shoulder blades. Lifting higher can put excessive stress on the lower back and does not further strengthen your abdominal muscles.  Control your return to the starting position. Repeat 2 times. Complete this exercise 3 times per week.   STRENGTHENING - Quadruped, Opposite UE/LE Lift   Assume a hands and knees position on a firm surface. Keep your hands under your shoulders and your knees under your hips. You may place padding under your knees for comfort.  Find your neutral spine and gently tense your abdominal muscles so that you can maintain this position. Your shoulders and hips should form a rectangle that is parallel with the floor and is not twisted.  Keeping your trunk steady, lift your right hand no higher than your shoulder and then your left leg no higher than your hip. Make sure you are not holding your breath. Hold this position for 30 seconds.  Continuing to keep your abdominal muscles tense and your back steady, slowly return to your starting position. Repeat with the opposite arm and leg. Repeat 2 times. Complete this exercise 3 times per week.   STRENGTHENING - Abdominals and Quadriceps, Straight Leg Raise   Lie on a firm bed or floor with both legs extended in front of you.  Keeping one leg in contact with the floor, bend the other knee so that your foot can rest flat on the floor.  Find your neutral spine, and tense your abdominal muscles to maintain your spinal position throughout the exercise.  Slowly lift your straight leg off the floor about 6 inches for a count of 3, making sure to not hold your breath.  Still keeping your neutral spine, slowly lower your leg all the way to the floor. Repeat this  exercise with each leg 2 times. Complete this exercise 3 times per week.  POSTURE AND BODY MECHANICS CONSIDERATIONS - Low Back Sprain Keeping correct posture when sitting, standing or completing your activities will reduce the stress put on different body tissues, allowing injured tissues a chance to heal and limiting painful experiences. The following are general guidelines for improved posture.  While reading these guidelines, remember:  The exercises prescribed by your provider will help you have the flexibility and strength to maintain correct postures.  The correct posture provides the best environment for your joints to work. All of your joints have less wear and tear when properly supported by a spine with good posture. This means you will experience a healthier, less painful body.  Correct posture must be practiced with all of your activities, especially prolonged sitting and standing. Correct posture is as important when doing repetitive low-stress activities (typing) as it is when doing a single heavy-load activity (lifting).  RESTING POSITIONS Consider which positions are most painful for you when choosing a resting position. If you have pain with flexion-based activities (sitting, bending, stooping, squatting), choose a position that allows you to rest in a less flexed posture. You would want to avoid curling into a fetal position on your side. If your pain worsens with extension-based activities (prolonged standing, working overhead), avoid resting in an extended position such as sleeping on your stomach. Most people will find more comfort when they rest with their spine in a more neutral position, neither too rounded nor too arched. Lying on a non-sagging bed on your side with a pillow between your knees, or on your back with a pillow under your knees will often provide some relief. Keep in mind, being in any one position for a prolonged period of time, no matter how correct your posture,  can still lead to stiffness.  PROPER SITTING POSTURE In order to minimize stress and discomfort on your spine, you must sit with correct posture. Sitting with good posture should be effortless for a healthy body. Returning to good posture is a gradual process. Many people can work toward this most comfortably by using various supports until they have the flexibility and strength to maintain this posture on their own. When sitting with proper posture, your ears will fall over your shoulders and your shoulders will fall over your hips. You should use the back of the chair to support your upper back. Your lower back will be in a neutral position, just slightly arched. You may place a small pillow or folded towel at the base of your lower back for  support.  When working at a desk, create an environment that supports good, upright posture. Without extra support, muscles tire, which leads to excessive strain on joints and other tissues. Keep these recommendations in mind:  CHAIR:  A chair should be able to slide under your desk when your back makes contact with the back of the chair. This allows you to work closely.  The chair's height should allow your eyes to be level with the upper part of your monitor and your hands to be slightly lower than your elbows.  BODY POSITION  Your feet should make contact with the floor. If this is not possible, use a foot rest.  Keep your ears over your shoulders. This will reduce stress on your neck and low back.  INCORRECT SITTING POSTURES  If you are feeling tired and unable to assume a healthy sitting posture, do not slouch or slump. This puts excessive strain on your back tissues, causing more damage and pain. Healthier options include:  Using more support, like a lumbar pillow.  Switching tasks to something that requires you to be upright or walking.  Talking a brief walk.  Lying down to rest in a neutral-spine position.  PROLONGED STANDING WHILE  SLIGHTLY LEANING FORWARD  When completing a task that requires you to  lean forward while standing in one place for a long time, place either foot up on a stationary 2-4 inch high object to help maintain the best posture. When both feet are on the ground, the lower back tends to lose its slight inward curve. If this curve flattens (or becomes too large), then the back and your other joints will experience too much stress, tire more quickly, and can cause pain.  CORRECT STANDING POSTURES Proper standing posture should be assumed with all daily activities, even if they only take a few moments, like when brushing your teeth. As in sitting, your ears should fall over your shoulders and your shoulders should fall over your hips. You should keep a slight tension in your abdominal muscles to brace your spine. Your tailbone should point down to the ground, not behind your body, resulting in an over-extended swayback posture.   INCORRECT STANDING POSTURES  Common incorrect standing postures include a forward head, locked knees and/or an excessive swayback. WALKING Walk with an upright posture. Your ears, shoulders and hips should all line-up.  PROLONGED ACTIVITY IN A FLEXED POSITION When completing a task that requires you to bend forward at your waist or lean over a low surface, try to find a way to stabilize 3 out of 4 of your limbs. You can place a hand or elbow on your thigh or rest a knee on the surface you are reaching across. This will provide you more stability, so that your muscles do not tire as quickly. By keeping your knees relaxed, or slightly bent, you will also reduce stress across your lower back. CORRECT LIFTING TECHNIQUES  DO :  Assume a wide stance. This will provide you more stability and the opportunity to get as close as possible to the object which you are lifting.  Tense your abdominals to brace your spine. Bend at the knees and hips. Keeping your back locked in a neutral-spine  position, lift using your leg muscles. Lift with your legs, keeping your back straight.  Test the weight of unknown objects before attempting to lift them.  Try to keep your elbows locked down at your sides in order get the best strength from your shoulders when carrying an object.     Always ask for help when lifting heavy or awkward objects. INCORRECT LIFTING TECHNIQUES DO NOT:   Lock your knees when lifting, even if it is a small object.  Bend and twist. Pivot at your feet or move your feet when needing to change directions.  Assume that you can safely pick up even a paperclip without proper posture.

## 2017-10-02 ENCOUNTER — Encounter: Payer: Self-pay | Admitting: Family Medicine

## 2017-10-02 LAB — HEPATITIS C ANTIBODY
Hepatitis C Ab: NONREACTIVE
SIGNAL TO CUT-OFF: 0.02 (ref <1.00)

## 2017-10-02 LAB — HIV ANTIBODY (ROUTINE TESTING W REFLEX): HIV 1&2 Ab, 4th Generation: NONREACTIVE

## 2017-10-03 ENCOUNTER — Other Ambulatory Visit: Payer: Self-pay | Admitting: Family Medicine

## 2017-10-03 ENCOUNTER — Other Ambulatory Visit (INDEPENDENT_AMBULATORY_CARE_PROVIDER_SITE_OTHER): Payer: Federal, State, Local not specified - PPO

## 2017-10-03 DIAGNOSIS — E875 Hyperkalemia: Secondary | ICD-10-CM

## 2017-10-03 LAB — COMPREHENSIVE METABOLIC PANEL
ALT: 20 U/L (ref 0–35)
AST: 19 U/L (ref 0–37)
Albumin: 4.4 g/dL (ref 3.5–5.2)
Alkaline Phosphatase: 68 U/L (ref 39–117)
BUN: 14 mg/dL (ref 6–23)
CALCIUM: 10.8 mg/dL — AB (ref 8.4–10.5)
CHLORIDE: 103 meq/L (ref 96–112)
CO2: 25 meq/L (ref 19–32)
Creatinine, Ser: 0.92 mg/dL (ref 0.40–1.20)
GFR: 65.06 mL/min (ref >60.00)
GLUCOSE: 88 mg/dL (ref 70–99)
POTASSIUM: 3.7 meq/L (ref 3.5–5.1)
Sodium: 137 mEq/L (ref 135–145)
Total Bilirubin: 0.8 mg/dL (ref 0.2–1.2)
Total Protein: 7.5 g/dL (ref 6.0–8.3)

## 2017-10-05 ENCOUNTER — Ambulatory Visit (HOSPITAL_BASED_OUTPATIENT_CLINIC_OR_DEPARTMENT_OTHER)
Admission: RE | Admit: 2017-10-05 | Discharge: 2017-10-05 | Disposition: A | Discharge status: Home / Self Care | Payer: Federal, State, Local not specified - PPO | Source: Ambulatory Visit | Attending: Family Medicine | Admitting: Family Medicine

## 2017-10-05 DIAGNOSIS — Z780 Asymptomatic menopausal state: Secondary | ICD-10-CM | POA: Diagnosis not present

## 2017-10-05 DIAGNOSIS — Z1211 Encounter for screening for malignant neoplasm of colon: Secondary | ICD-10-CM | POA: Insufficient documentation

## 2017-10-05 DIAGNOSIS — Z13820 Encounter for screening for osteoporosis: Secondary | ICD-10-CM | POA: Insufficient documentation

## 2017-10-18 ENCOUNTER — Ambulatory Visit (AMBULATORY_SURGERY_CENTER): Payer: Self-pay

## 2017-10-18 VITALS — Ht 64.0 in | Wt 207.2 lb

## 2017-10-18 DIAGNOSIS — Z1211 Encounter for screening for malignant neoplasm of colon: Secondary | ICD-10-CM

## 2017-10-18 MED ORDER — SOD PICOSULFATE-MAG OX-CIT ACD 10-3.5-12 MG-GM -GM/160ML PO SOLN: 1.0000 | Freq: Once | ORAL | Status: AC

## 2017-10-18 NOTE — Progress Notes (Signed)
Per pt, no allergies to soy or egg products.Pt not taking any weight loss meds or using  O2 at home.  Emmi video sent to pt's email. 

## 2017-11-12 ENCOUNTER — Encounter: Payer: Self-pay | Admitting: Gastroenterology

## 2017-11-23 ENCOUNTER — Telehealth: Payer: Self-pay | Admitting: Family Medicine

## 2017-11-23 NOTE — Telephone Encounter (Signed)
I practiced in [place] prior to [place]. I think my license expires there at the end of the year. I am licensed in [place] and plan to stay here. TY.

## 2017-11-23 NOTE — Telephone Encounter (Signed)
Called the patient informed of PCP instructions 

## 2017-11-23 NOTE — Telephone Encounter (Signed)
Copied from [place] [id number]. Topic: General - Other >> Nov 23, 2017 11:55 AM [name] wrote: Reason for CRM: Patient would like someone from the office to call her. She would like to know if Dr Nani Ravens is registered in [place] and not [place] or both?

## 2017-11-26 ENCOUNTER — Encounter: Payer: Self-pay | Admitting: Gastroenterology

## 2017-11-26 ENCOUNTER — Ambulatory Visit (AMBULATORY_SURGERY_CENTER): Payer: Federal, State, Local not specified - PPO | Admitting: Gastroenterology

## 2017-11-26 VITALS — BP 142/70 | HR 60 | Temp 98.4°F | Resp 14 | Ht 64.0 in | Wt 209.0 lb

## 2017-11-26 DIAGNOSIS — Z1211 Encounter for screening for malignant neoplasm of colon: Secondary | ICD-10-CM

## 2017-11-26 DIAGNOSIS — K635 Polyp of colon: Secondary | ICD-10-CM

## 2017-11-26 DIAGNOSIS — D125 Benign neoplasm of sigmoid colon: Secondary | ICD-10-CM

## 2017-11-26 DIAGNOSIS — D175 Benign lipomatous neoplasm of intra-abdominal organs: Secondary | ICD-10-CM | POA: Diagnosis not present

## 2017-11-26 MED ORDER — SODIUM CHLORIDE 0.9 % IV SOLN: 500.0000 mL | Freq: Once | INTRAVENOUS | Status: DC

## 2017-11-26 NOTE — Progress Notes (Signed)
Called to room to assist during endoscopic procedure.  Patient ID and intended procedure confirmed with present staff. Received instructions for my participation in the procedure from the performing physician.  

## 2017-11-26 NOTE — Patient Instructions (Signed)
YOU HAD AN ENDOSCOPIC PROCEDURE TODAY AT THE [facility]:   Refer to the procedure report that was given to you for any specific questions about what was found during the examination.  If the procedure report does not answer your questions, please call your gastroenterologist to clarify.  If you requested that your care partner not be given the details of your procedure findings, then the procedure report has been included in a sealed envelope for you to review at your convenience later.  YOU SHOULD EXPECT: Some feelings of bloating in the abdomen. Passage of more gas than usual.  Walking can help get rid of the air that was put into your GI tract during the procedure and reduce the bloating. If you had a lower endoscopy (such as a colonoscopy or flexible sigmoidoscopy) you may notice spotting of blood in your stool or on the toilet paper. If you underwent a bowel prep for your procedure, you may not have a normal bowel movement for a few days.  Please Note:  You might notice some irritation and congestion in your nose or some drainage.  This is from the oxygen used during your procedure.  There is no need for concern and it should clear up in a day or so.  SYMPTOMS TO REPORT IMMEDIATELY:   Following lower endoscopy (colonoscopy or flexible sigmoidoscopy):  Excessive amounts of blood in the stool  Significant tenderness or worsening of abdominal pains  Swelling of the abdomen that is new, acute  Fever of 100F or higher  For urgent or emergent issues, a gastroenterologist can be reached at any hour by calling [phone].   DIET:  We do recommend a small meal at first, but then you may proceed to your regular diet.  Drink plenty of fluids but you should avoid alcoholic beverages for 24 hours.  ACTIVITY:  You should plan to take it easy for the rest of today and you should NOT DRIVE or use heavy machinery until tomorrow (because of the sedation medicines used during the test).     FOLLOW UP: Our staff will call the number listed on your records the next business day following your procedure to check on you and address any questions or concerns that you may have regarding the information given to you following your procedure. If we do not reach you, we will leave a message.  However, if you are feeling well and you are not experiencing any problems, there is no need to return our call.  We will assume that you have returned to your regular daily activities without incident.  If any biopsies were taken you will be contacted by phone or by letter within the next 1-3 weeks.  Please call us at [phone] if you have not heard about the biopsies in 3 weeks.    SIGNATURES/CONFIDENTIALITY: You and/or your care partner have signed paperwork which will be entered into your electronic medical record.  These signatures attest to the fact that that the information above on your After Visit Summary has been reviewed and is understood.  Full responsibility of the confidentiality of this discharge information lies with you and/or your care-partner. 

## 2017-11-26 NOTE — Op Note (Signed)
[facility] Patient Name: Shelly Howell Procedure Date: 11/26/2017 7:48 AM MRN: [id number] Endoscopist: Jackquline Denmark , MD Age: 67 Referring MD:  Date of Birth: Jun 26, 1950 Gender: Female Account #: [id number] Procedure:                Colonoscopy Indications:              Screening for colorectal malignant neoplasm Medicines:                Monitored Anesthesia Care Procedure:                Pre-Anesthesia Assessment:                           - Prior to the procedure, a History and Physical                            was performed, and patient medications and                            allergies were reviewed. The patient's tolerance of                            previous anesthesia was also reviewed. The risks                            and benefits of the procedure and the sedation                            options and risks were discussed with the patient.                            All questions were answered, and informed consent                            was obtained. Prior Anticoagulants: The patient has                            taken no previous anticoagulant or antiplatelet                            agents. ASA Grade Assessment: II - A patient with                            mild systemic disease. After reviewing the risks                            and benefits, the patient was deemed in                            satisfactory condition to undergo the procedure.                           After obtaining informed consent, the colonoscope  was passed under direct vision. Throughout the                            procedure, the patient's blood pressure, pulse, and                            oxygen saturations were monitored continuously. The                            Colonoscope was introduced through the anus and                            advanced to the the cecum, identified by                            appendiceal orifice and  ileocecal valve. The                            colonoscopy was performed without difficulty. The                            patient tolerated the procedure well. The quality                            of the bowel preparation was good. Scope In: 8:07:20 AM Scope Out: 8:22:04 AM Scope Withdrawal Time: 0 hours 10 minutes 45 seconds  Total Procedure Duration: 0 hours 14 minutes 44 seconds  Findings:                 A 6 mm polyp was found in the distal sigmoid colon,                            25 cm from the anal verge. The polyp was sessile.                            The polyp was removed with a cold snare. Resection                            and retrieval were complete. Estimated blood loss:                            none.                           Non-bleeding internal hemorrhoids were found. The                            hemorrhoids were small.                           The exam was otherwise without abnormality on                            direct and retroflexion views. Complications:  No immediate complications. Estimated Blood Loss:     Estimated blood loss: none. Impression:               - Small colonic polyp status post polypectomy.                           - Non-bleeding internal hemorrhoids.                           - The examination was otherwise normal on direct                            and retroflexion views. Recommendation:           - Patient has a contact number available for                            emergencies. The signs and symptoms of potential                            delayed complications were discussed with the                            patient. Return to normal activities tomorrow.                            Written discharge instructions were provided to the                            patient.                           - Resume previous diet.                           - Continue present medications.                           - Await  pathology results.                           - Repeat colonoscopy for surveillance based on                            pathology results. Jackquline Denmark, MD 11/26/2017 8:28:02 AM This report has been signed electronically.

## 2017-11-26 NOTE — Progress Notes (Signed)
To PACU, VSS. Report to [email or web address] 

## 2017-11-27 ENCOUNTER — Telehealth: Payer: Self-pay | Admitting: *Deleted

## 2017-11-27 NOTE — Telephone Encounter (Signed)
  Follow up Call-  Call back number 11/26/2017  Post procedure Call Back phone  # [phone]  Permission to leave phone message Yes     Patient questions:  Do you have a fever, pain , or abdominal swelling? No. Pain Score  0 *  Have you tolerated food without any problems? Yes.    Have you been able to return to your normal activities? yes  Do you have any questions about your discharge instructions: Diet   No. Medications  No. Follow up visit  No.  Do you have questions or concerns about your Care? no  Actions: * If pain score is 4 or above: No action needed, pain <4.

## 2017-12-03 ENCOUNTER — Encounter: Payer: Self-pay | Admitting: Gastroenterology

## 2018-01-29 ENCOUNTER — Ambulatory Visit: Payer: Federal, State, Local not specified - PPO

## 2018-02-04 ENCOUNTER — Ambulatory Visit (INDEPENDENT_AMBULATORY_CARE_PROVIDER_SITE_OTHER): Payer: Federal, State, Local not specified - PPO

## 2018-02-04 DIAGNOSIS — Z23 Encounter for immunization: Secondary | ICD-10-CM

## 2018-02-05 ENCOUNTER — Ambulatory Visit: Payer: Self-pay

## 2018-02-05 ENCOUNTER — Telehealth: Payer: Self-pay

## 2018-02-05 NOTE — Telephone Encounter (Signed)
Pt c/o nausea, vomiting, body aches, headache and dizziness after receiving the Shingles vaccine yesterday. Symptoms began early this morning. Pt stated that she is hungry but cannot keep anything down. Pt stated she has vomited twice today. Pt stated that she is voiding but the inside of her mouth is dry. Pt denies abdominal pain, diarrhea, fever. Pt would like if Dr Nani Ravens could call an antiemetic to her pharmacy. Care advice given and pt verbalized understanding. Routing to PCP office   Reason for Disposition . Shingles (Herpes zoster; Shingrix) vaccine reactions . MILD or MODERATE vomiting (e.g., 1 - 5 times / day)  Additional Information . Commented on: Taking prescription medication that could cause nausea (e.g., narcotics/opiates, antibiotics, OCPs, many others)    Shingles vaccine  Answer Assessment - Initial Assessment Questions 1. VOMITING SEVERITY: "How many times have you vomited in the past 24 hours?"     - MILD:  1 - 2 times/day    - MODERATE: 3 - 5 times/day, decreased oral intake without significant weight loss or symptoms of dehydration    - SEVERE: 6 or more times/day, vomits everything or nearly everything, with significant weight loss, symptoms of dehydration      mild 2. ONSET: "When did the vomiting begin?"      This morning 3. FLUIDS: "What fluids or food have you vomited up today?" "Have you been able to keep any fluids down?"     Water -no 4. ABDOMINAL PAIN: "Are your having any abdominal pain?" If yes : "How bad is it and what does it feel like?" (e.g., crampy, dull, intermittent, constant)      no 5. DIARRHEA: "Is there any diarrhea?" If so, ask: "How many times today?"      no 6. CONTACTS: "Is there anyone else in the family with the same symptoms?"      no 7. CAUSE: "What do you think is causing your vomiting?"     Shingles vaccine 8. HYDRATION STATUS: "Any signs of dehydration?" (e.g., dry mouth [not only dry lips], too weak to stand) "When did you last  urinate?"     Yes- last void 10 minutes gone 6 times today cannot drink anything it makes her sick 9. OTHER SYMPTOMS: "Do you have any other symptoms?" (e.g., fever, headache, vertigo, vomiting blood or coffee grounds, recent head injury)   Headache, dizzy when she was laying down (comes and goes) 10. PREGNANCY: "Is there any chance you are pregnant?" "When was your last menstrual period?"       n/a  Answer Assessment - Initial Assessment Questions 1. SYMPTOMS: "What is the main symptom?" (e.g., redness, swelling, pain)      Nausea vomiting and headache 2. ONSET: "When was the vaccine (shot) given?" "How much later did the Shingles__ begin?" (e.g., hours, days ago)      yesterday 3. SEVERITY: "How bad is it?"      Pt is hungry but cannot keep anything down 4. FEVER: "Is there a fever?" If so, ask: "What is it, how was it measured, and when did it start?"      no 5. IMMUNIZATIONS GIVEN: "What shots have you recently received?"     shingles 6. PAST REACTIONS: "Have you reacted to immunizations before?" If so, ask: "What happened?"      7. OTHER SYMPTOMS: "Do you have any other symptoms?"     n/a  Answer Assessment - Initial Assessment Questions 1. NAUSEA SEVERITY: "How bad is the nausea?" (e.g., mild, moderate, severe; dehydration,  weight loss)   - MILD: loss of appetite without change in eating habits   - MODERATE: decreased oral intake without significant weight loss, dehydration, or malnutrition   - SEVERE: inadequate caloric or fluid intake, significant weight loss, symptoms of dehydration     Mild - is hungry but cannot keep it down 2. ONSET: "When did the nausea begin?"     yes 3. VOMITING: "Any vomiting?" If so, ask: "How many times today?"     yes 4. RECURRENT SYMPTOM: "Have you had nausea before?" If so, ask: "When was the last time?" "What happened that time?"     yes 5. CAUSE: "What do you think is causing the nausea?"     Shingles vaccine 6. PREGNANCY: "Is there any  chance you are pregnant?" (e.g., unprotected intercourse, missed birth control pill, broken condom)     n/a  Protocols used: IMMUNIZATION REACTIONS-A-AH, VOMITING-A-AH, NAUSEA-A-AH

## 2018-02-05 NOTE — Telephone Encounter (Signed)
Patient called in to say she is having nausea from her shingrix vaccination given yesterday. Please advise. Advised patients husband that office has closed for the day.

## 2018-02-06 MED ORDER — ONDANSETRON 8 MG PO TBDP: 8.0000 mg | ORAL_TABLET | Freq: Three times a day (TID) | ORAL | 0 refills | Status: DC | PRN

## 2018-02-06 NOTE — Telephone Encounter (Signed)
I saw this after office hours. I don't think nausea from the shingrix vaccine. Maybe coincidental. Best to be evaluated to determined if early GI illness occurring. Will send in zofran but offer to be seen Thursday or Friday if we have opening or if other office location has opening?

## 2018-02-06 NOTE — Addendum Note (Signed)
Addended by: Anabel Halon on: 02/06/2018 09:05 AM   Modules accepted: Orders

## 2018-02-07 MED ORDER — ONDANSETRON HCL 4 MG PO TABS: 4.0000 mg | ORAL_TABLET | Freq: Three times a day (TID) | ORAL | 0 refills | Status: DC | PRN

## 2018-02-07 NOTE — Addendum Note (Signed)
Addended by: Ames Coupe on: 02/07/2018 08:35 AM   Modules accepted: Orders

## 2018-02-07 NOTE — Telephone Encounter (Signed)
Done. If this continues, have her come in as we likely cannot blame Shingrix. TY.

## 2018-02-07 NOTE — Telephone Encounter (Signed)
Follow up call made to patient to see if she had filled RX for Zofran. Left message for return call if any further assiatance needed.

## 2018-02-07 NOTE — Telephone Encounter (Signed)
Patient informed of PCP instructions. 

## 2018-05-09 ENCOUNTER — Ambulatory Visit: Payer: Self-pay | Admitting: Family Medicine

## 2018-05-09 NOTE — Telephone Encounter (Signed)
While I would LOVE to test asymptomatic individuals, we do not have enough tests where that is being allowed at this time.

## 2018-05-09 NOTE — Telephone Encounter (Signed)
To my knowledge, there is no antibody test for COVID, only testing for RNA. Ty.

## 2018-05-09 NOTE — Telephone Encounter (Signed)
Called the patient informed of PCP instructions. She is referring to having the antibody test.   She thinks she may have had it in January.

## 2018-05-09 NOTE — Telephone Encounter (Signed)
Pt. Calling to ask if there is a test available to tell if you have had COVID 19. Triage nurse is not aware of any testing yet. Pt. Asks that this be forwarded to Dr. Nani Ravens. States her father is having heart surgery in [place] in 2 weeks, and she wants to know if she has been exposed. Reports her and her husband had contact with someone who had returned from [place] in Jan. And they both were very ill afterwards. Please advise pt.

## 2018-05-10 NOTE — Telephone Encounter (Signed)
Patient informed. 

## 2018-05-31 ENCOUNTER — Telehealth: Payer: Self-pay | Admitting: Family Medicine

## 2018-05-31 NOTE — Telephone Encounter (Signed)
Called to inform the patient last OV 09/2017 and that we are currently offering virtual Visits if in need to discuss any problems with Dr. Nani Ravens.  The patient currently is doing ok and is not in need of an appointment. She stated appreciation for letting her know.

## 2018-07-04 ENCOUNTER — Encounter: Payer: Self-pay | Admitting: Medical

## 2018-07-04 ENCOUNTER — Telehealth: Payer: Self-pay | Admitting: Family Medicine

## 2018-07-04 ENCOUNTER — Ambulatory Visit (INDEPENDENT_AMBULATORY_CARE_PROVIDER_SITE_OTHER): Payer: Medicare Other | Admitting: Medical

## 2018-07-04 ENCOUNTER — Other Ambulatory Visit: Payer: Self-pay

## 2018-07-04 ENCOUNTER — Ambulatory Visit: Payer: Self-pay | Admitting: *Deleted

## 2018-07-04 DIAGNOSIS — G4700 Insomnia, unspecified: Secondary | ICD-10-CM | POA: Diagnosis not present

## 2018-07-04 DIAGNOSIS — F329 Major depressive disorder, single episode, unspecified: Secondary | ICD-10-CM | POA: Diagnosis not present

## 2018-07-04 DIAGNOSIS — F32A Depression, unspecified: Secondary | ICD-10-CM

## 2018-07-04 MED ORDER — FLUOXETINE HCL 20 MG PO TABS: 20.0000 mg | ORAL_TABLET | Freq: Every day | ORAL | 0 refills | Status: DC

## 2018-07-04 NOTE — Patient Instructions (Signed)
For recent worsened depression will rx prozac as that seemed to have worked well for you in past. May need to increase dose on follow up.  For insomnia presently just use benadryl on occasion if needed as other meds you tried in past caused side effects. Hopefully as prozac takes full effect you will sleep better.  Follow up in 2 weeks with myself or with pcp. Sooner if needed. Please update me on worsening or changing mood before follow up.

## 2018-07-04 NOTE — Telephone Encounter (Signed)
Copied from [place] [id number]. Topic: Quick Communication - See Telephone Encounter >> Jul 04, 2018 10:42 AM Vernona Rieger wrote: CRM for notification. See Telephone encounter for: 07/04/18. Patient just had a virtual appt with Dr Nani Ravens. She was advised to call back with her blood pressure readings. She checked it once on her left arm and it was 159/83 and waited 5 minutes and did her right arm. It was 163/95. She said that her husband said their blood pressure machine is always about 5 points higher than the doctor office.

## 2018-07-04 NOTE — Progress Notes (Signed)
Subjective:    Patient ID: Shelly Howell, female    DOB: 02/05/1951, 68 y.o.   MRN: [id number]  HPI  Virtual Visit via Video Note  I connected with Shelly Howell on 07/04/18 at 10:00 AM EDT by a video enabled telemedicine application and verified that I am speaking with the correct person using two identifiers.  Location: Patient: home Provider: office.  Pt has not checked her bp today. Will update when she calls [place] back with bp reading.   I discussed the limitations of evaluation and management by telemedicine and the availability of in person appointments. The patient expressed understanding and agreed to proceed.  History of Present Illness:   Pt has hx of mild intermittent depression in the past. One time years ago she was on prozac in the past. She had to use that for about 1-2 months.   Recent retired since about 2019. Pt sad, depresed, fatigued, crying easily and having some insomnia over past month. 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  Pt states having some insomnia. Example last night took her to 3 am to fall asleep.   Observations/Objective: General-no acute distress, pleasant, oriented(teary eyed at times as she expresses how she feels) Lungs- on inspection lungs appear unlabored. Neck- no tracheal deviation or jvd on inspection. Neuro- gross motor function appears intact.  Assessment and Plan: For recent worsened depression will rx prozac as that seemed to have worked well for you in past. May need to increase dose on follow up.  For insomnia presently just use benadryl on occasion if needed as other meds you tried in past caused side effects. Hopefully as prozac takes full effect you will sleep better.  Follow up in 2 weeks with myself or with pcp. Sooner if needed. Please update me on worsening or changing mood before follow up.  Mackie Pai, PA-C  Follow Up Instructions:    I discussed the assessment and treatment plan with the patient. The  patient was provided an opportunity to ask questions and all were answered. The patient agreed with the plan and demonstrated an understanding of the instructions.   The patient was advised to call back or seek an in-person evaluation if the symptoms worsen or if the condition fails to improve as anticipated.     Mackie Pai, PA-C   Review of Systems  Constitutional: Negative for chills, fatigue and fever.  Respiratory: Negative for cough, chest tightness, shortness of breath and wheezing.   Cardiovascular: Negative for chest pain and palpitations.  Gastrointestinal: Negative for abdominal pain.  Musculoskeletal: Negative for back pain, myalgias and neck stiffness.  Skin: Negative for rash.  Neurological: Negative for dizziness.  Hematological: Negative for adenopathy. Does not bruise/bleed easily.  Psychiatric/Behavioral: Positive for dysphoric mood and sleep disturbance. Negative for behavioral problems, confusion, self-injury and suicidal ideas. The patient is nervous/anxious.    Past Medical History:  Diagnosis Date  . Allergy    seasonal  . Arthritis    back  . Depression   . Migraines   . Pneumonia    chronic/with a cough     Social History   Socioeconomic History  . Marital status: Married    Spouse name: Not on file  . Number of children: Not on file  . Years of education: Not on file  . Highest education level: Not on file  Occupational History  . Not on file  Social Needs  . Financial resource strain: Not on file  . Food insecurity:  Worry: Not on file    Inability: Not on file  . Transportation needs:    Medical: Not on file    Non-medical: Not on file  Tobacco Use  . Smoking status: Former Smoker    Last attempt to quit: 10/18/1980    Years since quitting: 37.7  . Smokeless tobacco: Never Used  Substance and Sexual Activity  . Alcohol use: Not Currently  . Drug use: Never  . Sexual activity: Not on file  Lifestyle  . Physical activity:     Days per week: Not on file    Minutes per session: Not on file  . Stress: Not on file  Relationships  . Social connections:    Talks on phone: Not on file    Gets together: Not on file    Attends religious service: Not on file    Active member of club or organization: Not on file    Attends meetings of clubs or organizations: Not on file    Relationship status: Not on file  . Intimate partner violence:    Fear of current or ex partner: Not on file    Emotionally abused: Not on file    Physically abused: Not on file    Forced sexual activity: Not on file  Other Topics Concern  . Not on file  Social History Narrative  . Not on file    Past Surgical History:  Procedure Laterality Date  . ABDOMINAL HYSTERECTOMY    . APPENDECTOMY    . DILATION AND CURETTAGE OF UTERUS    . OVARY SURGERY  06/1973   cyst on left ovary ruptured/ovary was removed    Family History  Problem Relation Age of Onset  . Cancer Mother   . Skin cancer Mother   . Alcohol abuse Father   . COPD Father   . Diabetes Father   . Cancer Sister   . Drug abuse Sister   . Heart disease Sister   . Cancer Brother   . Lung cancer Brother   . Stroke Maternal Grandmother   . Cancer Maternal Grandfather   . Healthy Sister     No Known Allergies  Current Outpatient Medications on File Prior to Visit  Medication Sig Dispense Refill  . Albuterol Sulfate 108 (90 Base) MCG/ACT AEPB Inhale 1-2 puffs into the lungs every 6 (six) hours as needed (Cough). (Patient not taking: Reported on 10/18/2017) 1 each 1  . Cholecalciferol (VITAMIN D) 2000 units CAPS Take by mouth.    Marland Kitchen glucosamine-chondroitin 500-400 MG tablet Take 1 tablet by mouth 3 (three) times daily.    . Multiple Vitamin (MULTIVITAMIN) tablet Take 1 tablet by mouth daily.    . Omega-3 Fatty Acids (FISH OIL PO) Take by mouth.    . ondansetron (ZOFRAN ODT) 8 MG disintegrating tablet Take 1 tablet (8 mg total) by mouth every 8 (eight) hours as needed for nausea  or vomiting. 20 tablet 0  . ondansetron (ZOFRAN) 4 MG tablet Take 1 tablet (4 mg total) by mouth every 8 (eight) hours as needed. 10 tablet 0   No current facility-administered medications on file prior to visit.     There were no vitals taken for this visit.      Objective:   Physical Exam        Assessment & Plan:

## 2018-07-04 NOTE — Telephone Encounter (Signed)
  Reason for Disposition . [1] Depression AND [2] worsening (e.g.,sleeping poorly, less able to do activities of daily living)  Answer Assessment - Initial Assessment Questions 1. CONCERN: "What happened that made you call today?"     Nothing in particular she can pinpoint. Depression seems to be worsening. 2. DEPRESSION SYMPTOM SCREENING: "How are you feeling overall?" (e.g., decreased energy, increased sleeping or difficulty sleeping, difficulty concentrating, feelings of sadness, guilt, hopelessness, or worthlessness)     Trouble sleeping, feelings of sadness,trouble concentrating 3. RISK OF HARM - SUICIDAL IDEATION:  "Do you ever have thoughts of hurting or killing yourself?"  (e.g., yes, no, no but preoccupation with thoughts about death)No   - INTENT:  "Do you have thoughts of hurting or killing yourself right NOW?" (e.g., yes, no, N/A)No   - PLAN: "Do you have a specific plan for how you would do this?" (e.g., gun, knife, overdose, no plan, N/A)     na 4. RISK OF HARM - HOMICIDAL IDEATION:  "Do you ever have thoughts of hurting or killing someone else?"  (e.g., yes, no, no but preoccupation with thoughts about death)No   - INTENT:  "Do you have thoughts of hurting or killing someone right NOW?" (e.g., yes, no, N/A)   - PLAN: "Do you have a specific plan for how you would do this?" (e.g., gun, knife, no plan, N/A)      Na 5. FUNCTIONAL IMPAIRMENT: "How have things been going for you overall in your life? 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 SUPPORT: "Who is with you now?" "Who do you live with?" "Do you have family or friends nearby who you can talk to?"      husband 7. THERAPIST: "Do you have a counselor or therapist? Name?"     no 8. STRESSORS: "Has there been any new stress or recent changes in your life?"     Yes a move and  retirement last year. 9. DRUG ABUSE/ALCOHOL: "Do you drink alcohol or use any illegal drugs?"      no 10. OTHER: "Do you have any other health or medical symptoms right now?" (e.g., fever)       no 11. PREGNANCY: "Is there any chance you are pregnant?" "When was your last menstrual period?"       no  Protocols used: DEPRESSION-A-AH

## 2018-07-04 NOTE — Telephone Encounter (Signed)
Virtual visit scheduled.  

## 2018-07-04 NOTE — Telephone Encounter (Signed)
Calls in with depression, crying, denies suicidal thoughts/intentions. Reports she has felt like this on/off for a very long time but has never discussed with physician. Says she has alternating moments of up and down emotions for no reason that she can identify. 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 Has never taken anti-depression medication nor sought counseling. Reviewed several coping mechanisms and encouraged her to talk about her feelings. Told to call back if she feels worse or thoughts of harming herself. She agreed. Transferred for appointment.

## 2018-07-05 NOTE — Telephone Encounter (Signed)
I was hoping for some more readings to base our decision off of. Do we have anymore? If not, check 3-4 times per week for next 2 week and then send me a MyChart message. Ty.

## 2018-07-05 NOTE — Telephone Encounter (Signed)
Patient had no more readings but will do as instructed over the next 2 weeks

## 2018-07-31 ENCOUNTER — Other Ambulatory Visit: Payer: Self-pay | Admitting: Medical

## 2018-09-13 ENCOUNTER — Other Ambulatory Visit: Payer: Self-pay | Admitting: *Deleted

## 2018-09-13 MED ORDER — FLUOXETINE HCL 20 MG PO TABS: ORAL_TABLET | ORAL | 0 refills | Status: DC

## 2018-10-11 ENCOUNTER — Other Ambulatory Visit: Payer: Self-pay | Admitting: Family Medicine

## 2018-10-11 MED ORDER — FLUOXETINE HCL 20 MG PO TABS: ORAL_TABLET | ORAL | 0 refills | Status: DC

## 2018-11-15 ENCOUNTER — Other Ambulatory Visit: Payer: Self-pay

## 2018-11-15 ENCOUNTER — Ambulatory Visit (INDEPENDENT_AMBULATORY_CARE_PROVIDER_SITE_OTHER): Payer: Medicare Other

## 2018-11-15 DIAGNOSIS — Z23 Encounter for immunization: Secondary | ICD-10-CM

## 2018-11-19 ENCOUNTER — Other Ambulatory Visit: Payer: Self-pay | Admitting: Family Medicine

## 2018-11-19 NOTE — Telephone Encounter (Signed)
Copied from [place] [id number]. Topic: Quick Communication - Rx Refill/Question >> Nov 19, 2018 12:38 PM Rainey Pines A wrote: Medication: FLUoxetine (PROZAC) 20 MG tablet (Patient stated that pharmacy has been waiting on medication to be sent over.)  Has the patient contacted their pharmacy? Yes (Agent: If no, request that the patient contact the pharmacy for the refill.) (Agent: If yes, when and what did the pharmacy advise?)Contact PCP  Preferred Pharmacy (with phone number or street name): [id number] - [place], [place] - [address] [phone] (Phone) [phone] (Fax)    Agent: Please be advised that RX refills may take up to 3 business days. We ask that you follow-up with your pharmacy.

## 2018-11-20 ENCOUNTER — Other Ambulatory Visit (HOSPITAL_BASED_OUTPATIENT_CLINIC_OR_DEPARTMENT_OTHER): Payer: Self-pay | Admitting: Family Medicine

## 2018-11-20 ENCOUNTER — Encounter: Payer: Self-pay | Admitting: Family Medicine

## 2018-11-20 ENCOUNTER — Telehealth: Payer: Self-pay | Admitting: Family Medicine

## 2018-11-20 DIAGNOSIS — Z1231 Encounter for screening mammogram for malignant neoplasm of breast: Secondary | ICD-10-CM

## 2018-11-22 ENCOUNTER — Other Ambulatory Visit: Payer: Self-pay | Admitting: Family Medicine

## 2018-11-22 DIAGNOSIS — Z1239 Encounter for other screening for malignant neoplasm of breast: Secondary | ICD-10-CM

## 2018-11-22 NOTE — Progress Notes (Signed)
mamm

## 2018-12-05 ENCOUNTER — Ambulatory Visit (INDEPENDENT_AMBULATORY_CARE_PROVIDER_SITE_OTHER): Payer: Medicare Other | Admitting: Family Medicine

## 2018-12-05 ENCOUNTER — Other Ambulatory Visit: Payer: Self-pay

## 2018-12-05 ENCOUNTER — Encounter: Payer: Self-pay | Admitting: Family Medicine

## 2018-12-05 VITALS — BP 143/68 | HR 78 | Ht 64.0 in | Wt 217.0 lb

## 2018-12-05 DIAGNOSIS — Z01419 Encounter for gynecological examination (general) (routine) without abnormal findings: Secondary | ICD-10-CM | POA: Diagnosis not present

## 2018-12-05 DIAGNOSIS — N611 Abscess of the breast and nipple: Secondary | ICD-10-CM

## 2018-12-05 MED ORDER — FLUOXETINE HCL 20 MG PO TABS: ORAL_TABLET | ORAL | 0 refills | Status: DC

## 2018-12-05 MED ORDER — SULFAMETHOXAZOLE-TRIMETHOPRIM 800-160 MG PO TABS: 1.0000 | ORAL_TABLET | Freq: Two times a day (BID) | ORAL | 0 refills | Status: AC

## 2018-12-05 NOTE — Telephone Encounter (Signed)
Patient was notified, Rx sent.

## 2018-12-05 NOTE — Telephone Encounter (Signed)
Pt came in office stating her pharmacy advised her and she saw mychart that refills were denied. Advised pt appt needed since over 1 year since she saw PCP. Pt is scheduled for virtual 11/2 but has been out almost a month and having side effects. Pt requesting refill of FLUoxetine (PROZAC) 20 MG tablet .   [facility] [id number] - [place], [place] - [address] [place] [address] [phone] (Phone) [phone] (Fax)

## 2018-12-05 NOTE — Progress Notes (Signed)
GYNECOLOGY ANNUAL PREVENTATIVE CARE ENCOUNTER NOTE  Subjective:   Shelly Howell is a 68 y.o. G67P3000 female here for a routine annual gynecologic exam.  Current complaints: abscess on right breast - gets them every couple years. Has been draining. Had hysterectomy about 14 years ago. This was an abdominal hysterectomy, but was told she still has a cervix.   Denies abnormal vaginal bleeding, discharge, pelvic pain, problems with intercourse or other gynecologic concerns.    Gynecologic History No LMP recorded. Patient has had a hysterectomy.  Last Pap: years ago. Last mammogram: not sure. Scheduled for mammogram next week  Obstetric History OB History  Gravida Para Term Preterm AB Living  3 3 3         SAB TAB Ectopic Multiple Live Births          3    # Outcome Date GA Lbr Len/2nd Weight Sex Delivery Anes PTL Lv  3 Term      Vag-Spont     2 Term      Vag-Spont     1 Term      Vag-Spont       Past Medical History:  Diagnosis Date  . Allergy    seasonal  . Arthritis    back  . Depression   . Migraines   . Pneumonia    chronic/with a cough    Past Surgical History:  Procedure Laterality Date  . ABDOMINAL HYSTERECTOMY    . APPENDECTOMY    . DILATION AND CURETTAGE OF UTERUS    . OVARY SURGERY  06/1973   cyst on left ovary ruptured/ovary was removed    Current Outpatient Medications on File Prior to Visit  Medication Sig Dispense Refill  . Cholecalciferol (VITAMIN D) 2000 units CAPS Take by mouth.    Marland Kitchen glucosamine-chondroitin 500-400 MG tablet Take 1 tablet by mouth 3 (three) times daily.    . Multiple Vitamin (MULTIVITAMIN) tablet Take 1 tablet by mouth daily.    . Omega-3 Fatty Acids (FISH OIL PO) Take by mouth.    . Albuterol Sulfate 108 (90 Base) MCG/ACT AEPB Inhale 1-2 puffs into the lungs every 6 (six) hours as needed (Cough). (Patient not taking: Reported on 10/18/2017) 1 each 1  . FLUoxetine (PROZAC) 20 MG tablet TAKE 1 TABLET(20 MG) BY MOUTH DAILY.  Needs  follow up visit before any more refills. 30 tablet 0  . ondansetron (ZOFRAN ODT) 8 MG disintegrating tablet Take 1 tablet (8 mg total) by mouth every 8 (eight) hours as needed for nausea or vomiting. (Patient not taking: Reported on 12/05/2018) 20 tablet 0  . ondansetron (ZOFRAN) 4 MG tablet Take 1 tablet (4 mg total) by mouth every 8 (eight) hours as needed. 10 tablet 0   No current facility-administered medications on file prior to visit.     No Known Allergies  Social History   Socioeconomic History  . Marital status: Married    Spouse name: Not on file  . Number of children: Not on file  . Years of education: Not on file  . Highest education level: Not on file  Occupational History  . Not on file  Social Needs  . Financial resource strain: Not on file  . Food insecurity    Worry: Not on file    Inability: Not on file  . Transportation needs    Medical: Not on file    Non-medical: Not on file  Tobacco Use  . Smoking status: Former Smoker  Quit date: 10/18/1980    Years since quitting: 38.1  . Smokeless tobacco: Never Used  Substance and Sexual Activity  . Alcohol use: Not Currently  . Drug use: Never  . Sexual activity: Not on file  Lifestyle  . Physical activity    Days per week: Not on file    Minutes per session: Not on file  . Stress: Not on file  Relationships  . Social Herbalist on phone: Not on file    Gets together: Not on file    Attends religious service: Not on file    Active member of club or organization: Not on file    Attends meetings of clubs or organizations: Not on file    Relationship status: Not on file  . Intimate partner violence    Fear of current or ex partner: Not on file    Emotionally abused: Not on file    Physically abused: Not on file    Forced sexual activity: Not on file  Other Topics Concern  . Not on file  Social History Narrative  . Not on file    Family History  Problem Relation Age of Onset  . Cancer  Mother   . Skin cancer Mother   . Alcohol abuse Father   . COPD Father   . Diabetes Father   . Cancer Sister   . Drug abuse Sister   . Heart disease Sister   . Cancer Brother   . Lung cancer Brother   . Stroke Maternal Grandmother   . Cancer Maternal Grandfather   . Healthy Sister     The following portions of the patient's history were reviewed and updated as appropriate: allergies, current medications, past family history, past medical history, past social history, past surgical history and problem list.  Review of Systems Pertinent items are noted in HPI.   Objective:  BP (!) 143/68   Pulse 78   Ht 5\' 4"  (1.626 m)   Wt 217 lb (98.4 kg)   BMI 37.25 kg/m  Wt Readings from Last 3 Encounters:  12/05/18 217 lb (98.4 kg)  11/26/17 209 lb (94.8 kg)  10/18/17 207 lb 3.2 oz (94 kg)     CONSTITUTIONAL: Well-developed, well-nourished female in no acute distress.  HENT:  Normocephalic, atraumatic, External right and left ear normal. Oropharynx is clear and moist EYES: Conjunctivae and EOM are normal. Pupils are equal, round, and reactive to light. No scleral icterus.  NECK: Normal range of motion, supple, no masses.  Normal thyroid.   CARDIOVASCULAR: Normal heart rate noted, regular rhythm RESPIRATORY: Clear to auscultation bilaterally. Effort and breath sounds normal, no problems with respiration noted. BREASTS: Symmetric in size. No masses, nipple drainage, or lymphadenopathy. Has abscess that is already draining on right breast at 6 o'clock, on underside of breast ABDOMEN: Soft, normal bowel sounds, no distention noted.  No tenderness, rebound or guarding.  PELVIC: Normal appearing external genitalia; mildly atrophic appearing vaginal mucosa.  No abnormal discharge noted. Uterus and cervix surgically absent. Vaginal cuff normal. MUSCULOSKELETAL: Normal range of motion. No tenderness.  No cyanosis, clubbing, or edema.  2+ distal pulses. SKIN: Skin is warm and dry. No rash noted.  Not diaphoretic. No erythema. No pallor. NEUROLOGIC: Alert and oriented to person, place, and time. Normal reflexes, muscle tone coordination. No cranial nerve deficit noted. PSYCHIATRIC: Normal mood and affect. Normal behavior. Normal judgment and thought content.  Assessment:  Annual gynecologic examination with pap smear   Plan:  1. Well Woman Exam No PAP done due to patient's age.  Mammogram already ordered.   2. Breast abscess Bactrim prescribed.   Routine preventative health maintenance measures emphasized. Please refer to After Visit Summary for other counseling recommendations.    Loma Boston, [place] for [employer]

## 2018-12-05 NOTE — Addendum Note (Signed)
Addended by: Ames Coupe on: 12/05/2018 11:44 AM   Modules accepted: Orders

## 2018-12-05 NOTE — Telephone Encounter (Signed)
Sent in 1 mo. Take 1/2 tab daily for a couple weeks to ease back into things. Ty.

## 2018-12-09 ENCOUNTER — Other Ambulatory Visit: Payer: Self-pay

## 2018-12-09 ENCOUNTER — Ambulatory Visit (INDEPENDENT_AMBULATORY_CARE_PROVIDER_SITE_OTHER): Payer: Medicare Other | Admitting: Family Medicine

## 2018-12-09 ENCOUNTER — Encounter: Payer: Self-pay | Admitting: Family Medicine

## 2018-12-09 DIAGNOSIS — F329 Major depressive disorder, single episode, unspecified: Secondary | ICD-10-CM | POA: Diagnosis not present

## 2018-12-09 DIAGNOSIS — N3941 Urge incontinence: Secondary | ICD-10-CM

## 2018-12-09 DIAGNOSIS — F32A Depression, unspecified: Secondary | ICD-10-CM

## 2018-12-09 MED ORDER — FLUOXETINE HCL 20 MG PO TABS: ORAL_TABLET | ORAL | 3 refills | Status: DC

## 2018-12-09 NOTE — Patient Instructions (Signed)

## 2018-12-09 NOTE — Progress Notes (Signed)
Chief Complaint  Patient presents with  . Follow-up    Subjective Shelly Howell presents for f/u anxiety/depression. Due to COVID-19 pandemic, we are interacting via web portal for an electronic face-to-face visit. I verified patient's ID using 2 identifiers. Patient agreed to proceed with visit via this method. Patient is at home, I am at office. Patient and I are present for visit.   She is currently being treated with Prozac 20 mg/d.  Reports good benefit since treatment. No thoughts of harming self or others. No self-medication with alcohol, prescription drugs or illicit drugs. Pt is not following with a counselor/psychologist.  Over past several weeks, has had difficulty controlling urine in AM or during day on her way to restroom. No pain or bleeding. No issues when coughing/sneezing/laughing. Has not tried anything, not interested in medication. Does drink a lot of caffeine, no EtOH.   ROS Psych: No homicidal or suicidal thoughts  Past Medical History:  Diagnosis Date  . Allergy    seasonal  . Arthritis    back  . Depression   . Migraines    Exam No conversational dyspnea Age appropriate judgment and insight Nml affect and mood  Assessment and Plan  Depression, unspecified depression type - Plan: FLUoxetine (PROZAC) 20 MG tablet  Urge incontinence  1- Cont Prozac. [facility] info given. 2- Kegel's. Counseling on behavior. Declined medication at this time.  F/u in 1 yr or prn. The patient voiced understanding and agreement to the plan.  [place], DO 12/09/18 3:28 PM

## 2019-10-21 ENCOUNTER — Encounter: Payer: Self-pay | Admitting: Family Medicine

## 2019-10-21 ENCOUNTER — Ambulatory Visit (INDEPENDENT_AMBULATORY_CARE_PROVIDER_SITE_OTHER): Payer: Medicare Other | Admitting: Family Medicine

## 2019-10-21 ENCOUNTER — Other Ambulatory Visit: Payer: Self-pay

## 2019-10-21 VITALS — BP 140/90 | HR 85 | Temp 99.3°F | Ht 64.5 in | Wt 226.1 lb

## 2019-10-21 DIAGNOSIS — Z1231 Encounter for screening mammogram for malignant neoplasm of breast: Secondary | ICD-10-CM | POA: Diagnosis not present

## 2019-10-21 DIAGNOSIS — S91319A Laceration without foreign body, unspecified foot, initial encounter: Secondary | ICD-10-CM | POA: Diagnosis not present

## 2019-10-21 DIAGNOSIS — R030 Elevated blood-pressure reading, without diagnosis of hypertension: Secondary | ICD-10-CM

## 2019-10-21 DIAGNOSIS — Z23 Encounter for immunization: Secondary | ICD-10-CM

## 2019-10-21 NOTE — Patient Instructions (Addendum)
Call your insurance company to see if they will cover a physical. If not, let me a know.   Continue triple antibiotic ointment, consider ice.   Take 1/2 tab of your Prozac daily for 4 weeks. Then stop. Let me know if there are issues.  Add some weight resistance exercise to your regimen. Continue with the dancing.  Goal blood pressure is <150/90 consistently.   Let us know if you need anything.

## 2019-10-21 NOTE — Progress Notes (Signed)
Chief Complaint  Patient presents with  . Hypertension    Subjective Shelly Howell is a 69 y.o. female who presents for elevated blood pressure. She does monitor home blood pressures. Blood pressures ranging from 130-150's/80-90's on average. She is not on medications. Patient has these side effects of medication: none She is not always adhering to a healthy diet overall. Current exercise: dancing   Several days ago she hit the top of her right foot on a bed frame.  It does not hurt to touch, but is still sore.  There is no drainage, spreading redness, fevers, foul odor, or increasing pain.  She has been putting triple antibiotic ointment over the top.  Last tetanus shot was in 2013.   Past Medical History:  Diagnosis Date  . Allergy    seasonal  . Arthritis    back  . Depression   . Migraines     Exam BP 140/90 (BP Location: Left Arm, Patient Position: Sitting, Cuff Size: Normal)   Pulse 85   Temp 99.3 F (37.4 C) (Oral)   Ht 5' 4.5" (1.638 m)   Wt 226 lb 2 oz (102.6 kg)   SpO2 93%   BMI 38.21 kg/m  General:  well developed, well nourished, in no apparent distress Heart: RRR, no bruits, no LE edema Lungs: clear to auscultation, no accessory muscle use Skin: She has a 2 cm area of excoriation with a thin rim of erythema surrounding on the dorsum of her right foot medially; there is no fluctuance, excessive warmth, or much tenderness to palpation Psych: well oriented with normal range of affect and appropriate judgment/insight  Elevated blood pressure reading  Cut of foot  Encounter for screening mammogram for malignant neoplasm of breast - Plan: MM DIGITAL SCREENING BILATERAL  Need for Tdap vaccination - Plan: Tdap vaccine greater than or equal to 7yo IM  1.  Continue to monitor blood pressure at home.  Counseled on diet and exercise.  For her age, goal blood pressure is less than 150/90.  She will let us know if readings are greater than that routinely. 2.   Tetanus shot today.  Continue triple antibiotic ointment.  No signs of infection.  She likely has a contusion underneath the cut. Needs to find out if her insurance will cover a physical. The patient voiced understanding and agreement to the plan.  [place], DO 10/21/19  12:16 PM

## 2019-10-31 ENCOUNTER — Other Ambulatory Visit: Payer: Self-pay | Admitting: Family Medicine

## 2019-10-31 DIAGNOSIS — N6489 Other specified disorders of breast: Secondary | ICD-10-CM

## 2019-11-07 DIAGNOSIS — Z23 Encounter for immunization: Secondary | ICD-10-CM | POA: Diagnosis not present

## 2019-11-20 DIAGNOSIS — Z20822 Contact with and (suspected) exposure to covid-19: Secondary | ICD-10-CM | POA: Diagnosis not present

## 2019-11-26 ENCOUNTER — Other Ambulatory Visit: Payer: Federal, State, Local not specified - PPO

## 2019-12-01 ENCOUNTER — Other Ambulatory Visit: Payer: Self-pay | Admitting: Family Medicine

## 2019-12-01 DIAGNOSIS — N6489 Other specified disorders of breast: Secondary | ICD-10-CM

## 2019-12-03 ENCOUNTER — Other Ambulatory Visit: Payer: Self-pay

## 2019-12-03 ENCOUNTER — Ambulatory Visit
Admission: RE | Admit: 2019-12-03 | Discharge: 2019-12-03 | Disposition: A | Discharge status: Home / Self Care | Payer: Medicare Other | Source: Ambulatory Visit | Attending: Family Medicine | Admitting: Family Medicine

## 2019-12-03 ENCOUNTER — Ambulatory Visit: Payer: Federal, State, Local not specified - PPO

## 2019-12-03 DIAGNOSIS — R928 Other abnormal and inconclusive findings on diagnostic imaging of breast: Secondary | ICD-10-CM | POA: Diagnosis not present

## 2019-12-03 DIAGNOSIS — N6489 Other specified disorders of breast: Secondary | ICD-10-CM

## 2019-12-05 ENCOUNTER — Ambulatory Visit: Payer: Federal, State, Local not specified - PPO

## 2020-01-18 IMAGING — MG DIGITAL DIAGNOSTIC UNILATERAL LEFT MAMMOGRAM WITH TOMO AND CAD
5 series · 6 of 17 positions shown · non-contrast
Comparison: 09/06/2017 new baseline screening mammogram

CLINICAL DATA: 67-year-old female for further evaluation of LEFT
breast asymmetry identified on new baseline screening mammogram.

EXAM:
DIGITAL DIAGNOSTIC LEFT MAMMOGRAM WITH CAD AND TOMO
ULTRASOUND LEFT BREAST

[L CC synth-2D (1 of 2)]
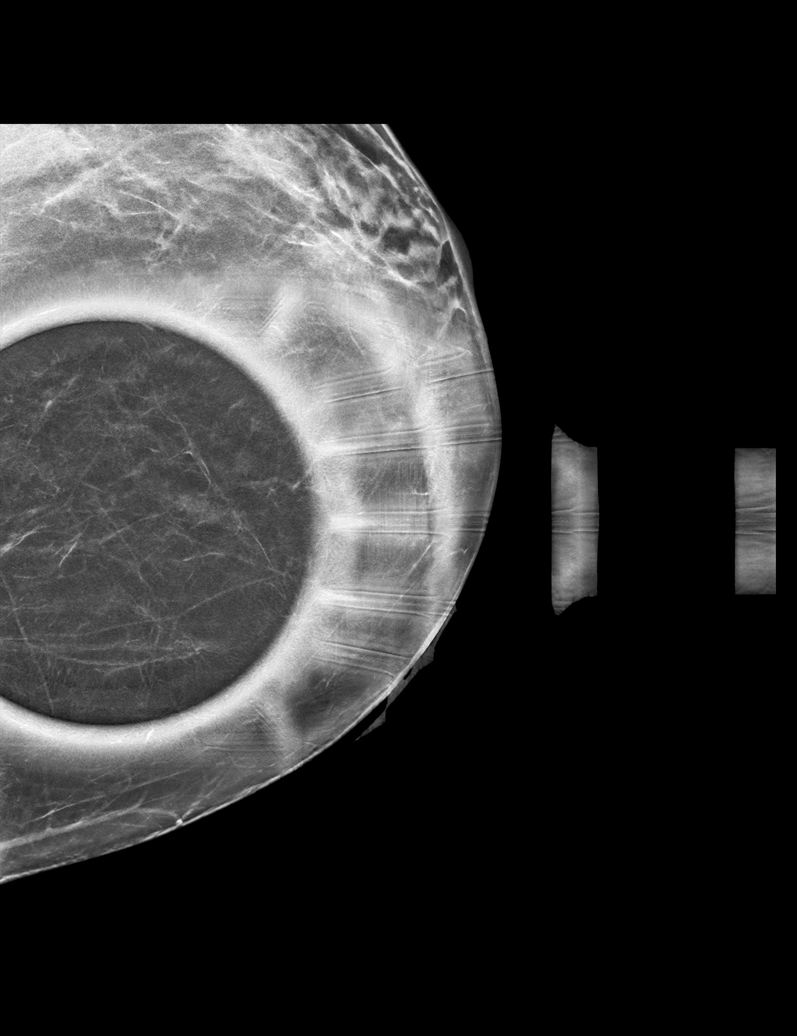

[L CC synth-2D (2 of 2)]
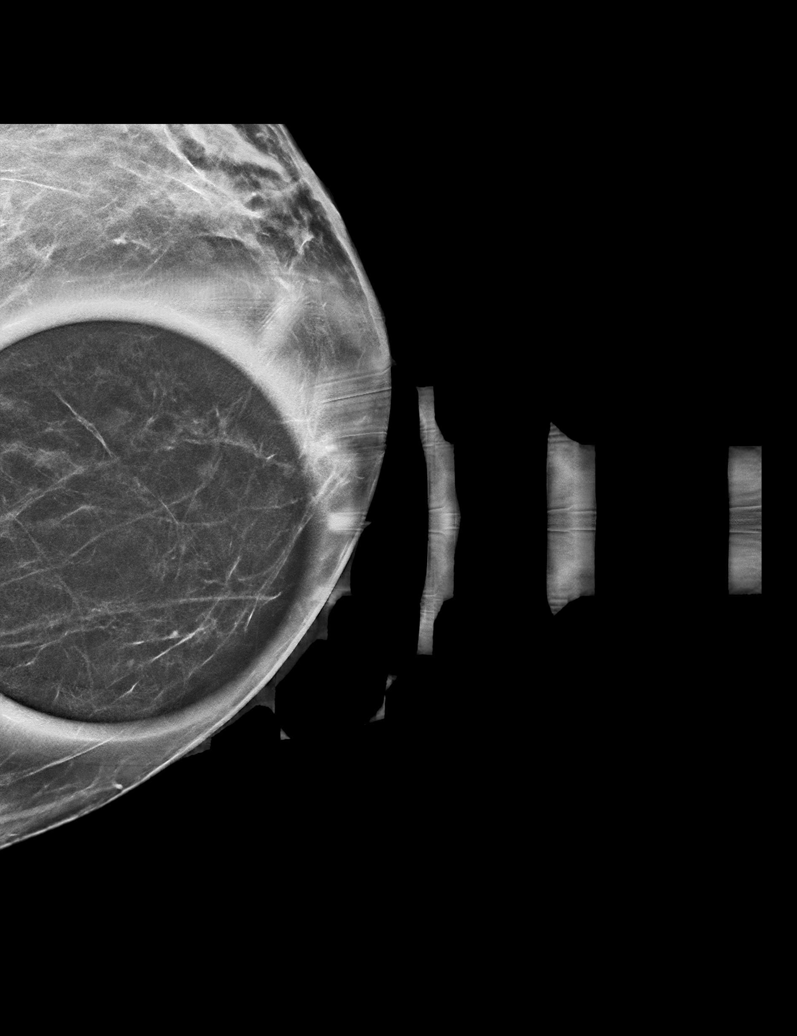

[L ML tomo · 2 of 71 frames shown]
[frame 23/71]
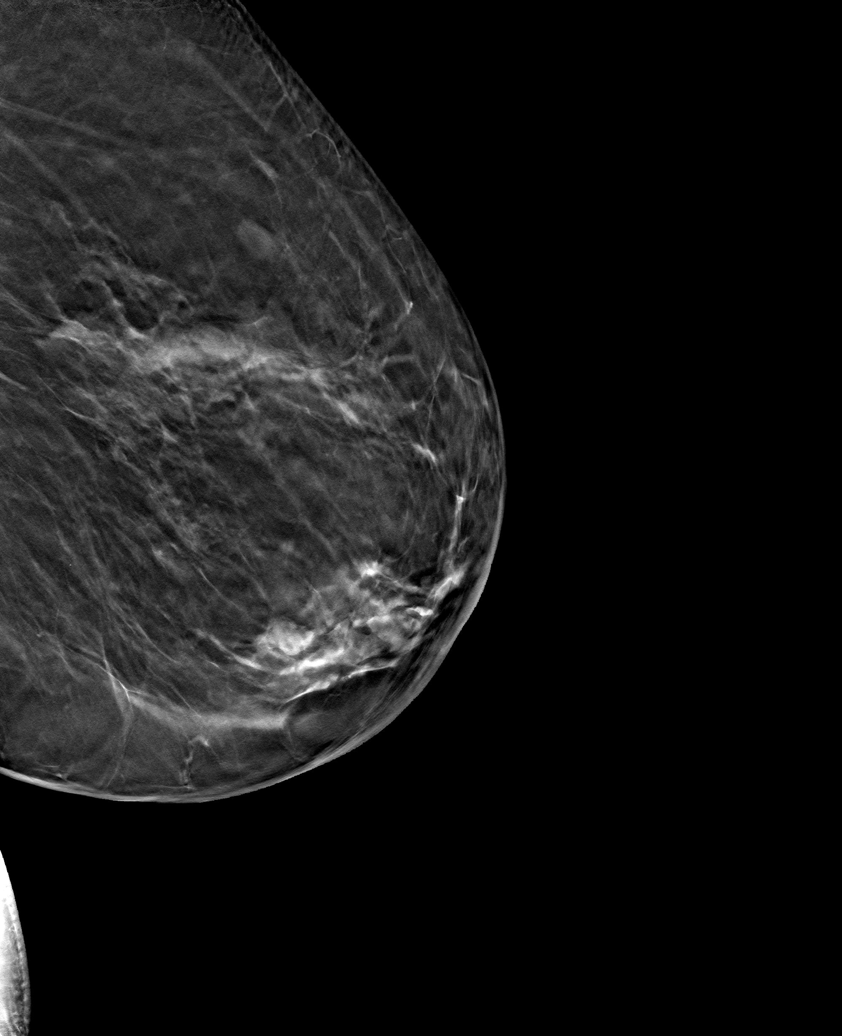
[frame 36/71]
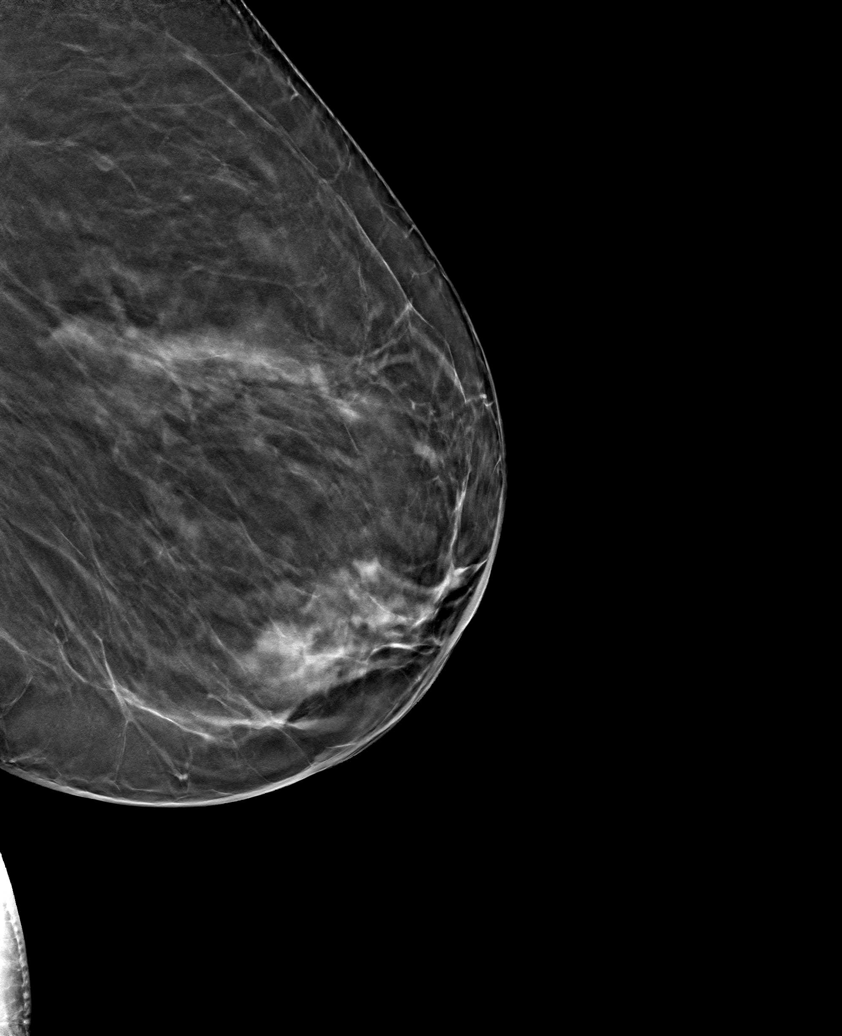

[L CC tomo (1 of 2) · tomo slice 29/56.0]
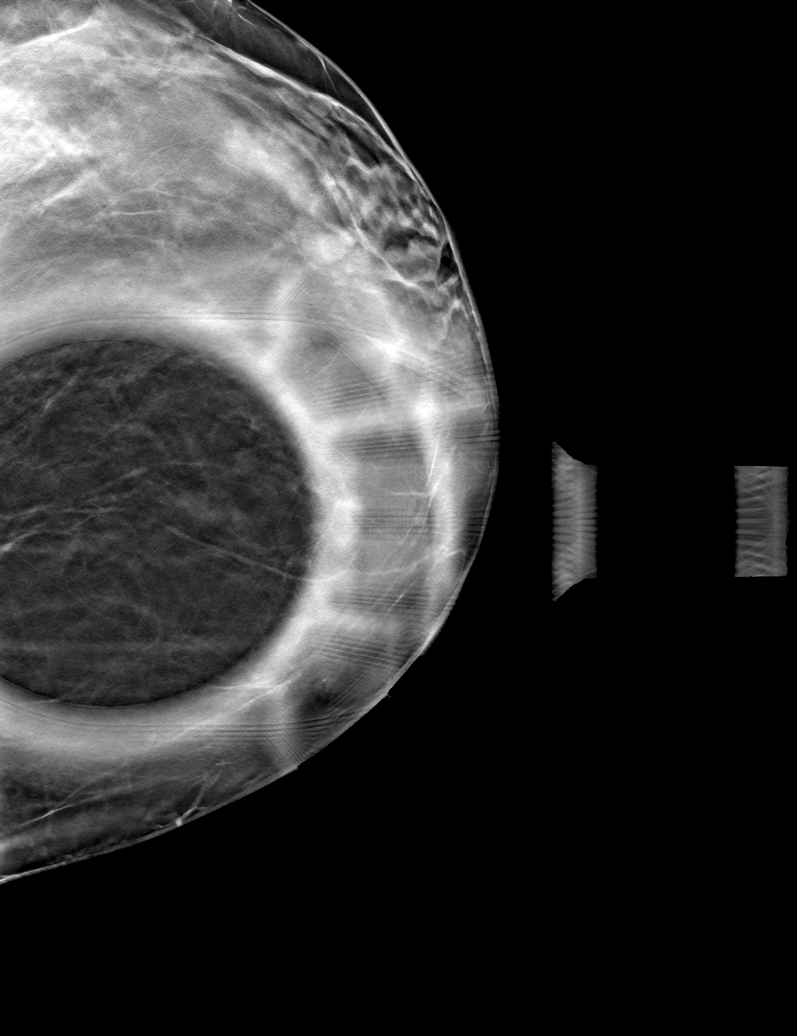

[L CC tomo (2 of 2) · tomo slice 27/53.0]
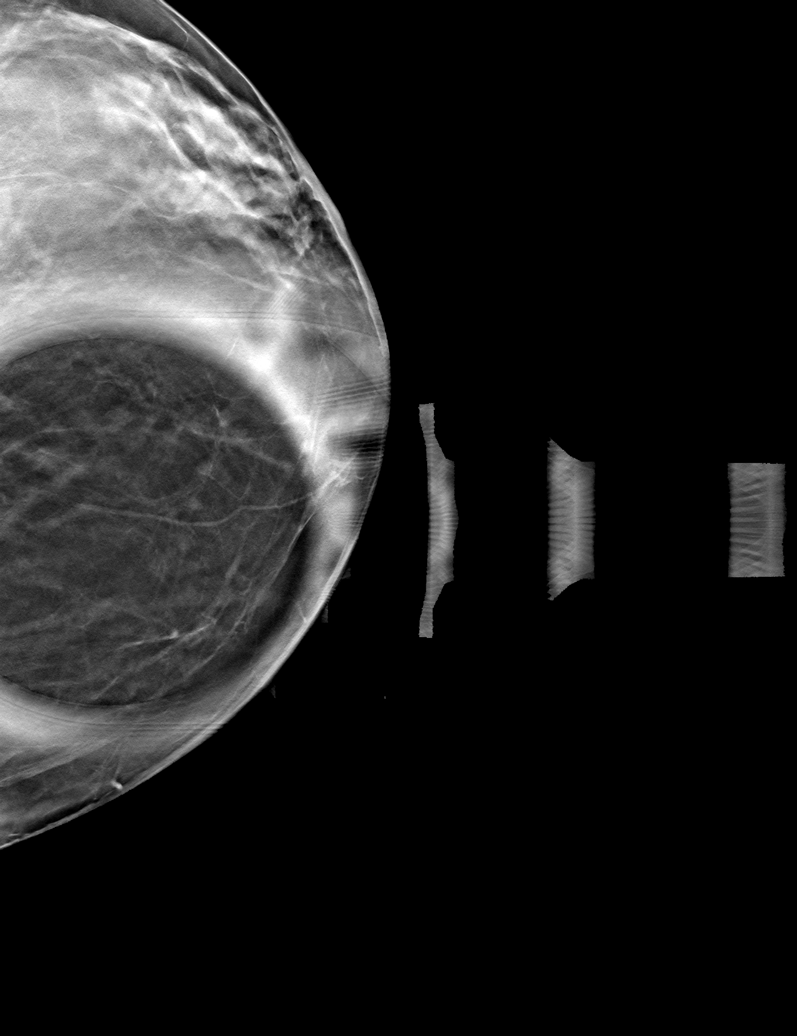

[6 of 17 positions shown; findings below may reference images not displayed]

ACR Breast Density Category b: There are scattered areas of
fibroglandular density.
FINDINGS: 2D/3D full field and spot compression views of the LEFT breast
demonstrate a 3 x 8 mm low-density asymmetry within the INNER LEFT
breast.

Targeted ultrasound is performed, showing a 7 x 2 x 8 mm benign cyst
at the 11 o'clock position of the LEFT breast 5 cm from the nipple,
most likely representing the asymmetry identified on the screening
study. No solid mass, distortion or abnormal shadowing within the
INNER LEFT breast identified.
IMPRESSION: 8 mm benign cyst within the UPPER INNER LEFT breast, probably
representing the mammographic asymmetry. Six-month followup is
recommended to ensure mammographic stability.

RECOMMENDATION:
LEFT diagnostic mammogram with possible LEFT breast ultrasound in 6
months.

I have discussed the findings and recommendations with the patient.
Results were also provided in writing at the conclusion of the
visit. If applicable, a reminder letter will be sent to the patient
regarding the next appointment.

BI-RADS CATEGORY  3: Probably benign.

## 2020-02-04 ENCOUNTER — Telehealth: Payer: Self-pay | Admitting: Family Medicine

## 2020-02-04 DIAGNOSIS — S39012A Strain of muscle, fascia and tendon of lower back, initial encounter: Secondary | ICD-10-CM | POA: Diagnosis not present

## 2020-02-04 NOTE — Telephone Encounter (Signed)
Open in error

## 2020-02-05 ENCOUNTER — Ambulatory Visit: Payer: Medicare Other | Admitting: Internal Medicine

## 2020-02-24 ENCOUNTER — Telehealth: Payer: Self-pay | Admitting: Family Medicine

## 2020-02-24 DIAGNOSIS — F32A Depression, unspecified: Secondary | ICD-10-CM

## 2020-02-24 MED ORDER — FLUOXETINE HCL 20 MG PO TABS: ORAL_TABLET | ORAL | 2 refills | Status: DC

## 2020-02-24 NOTE — Telephone Encounter (Signed)
I thought she stopped this? Did we have a bookkeeping error or was she interested in restarting? That would affect the dosing. Ty.

## 2020-02-24 NOTE — Telephone Encounter (Signed)
Pharmacy requesting a refill for Fluoxetine and is not on current list. Advise please.

## 2020-02-24 NOTE — Telephone Encounter (Signed)
Called her and she had stopped, But because she needed again due to symptoms starting up again shortly after her last appt 10/21/19 (off maybe 3 weeks, then restarted)...she restarted. She does have some left from her bottle but will run out in another month. She does apologize for not letting you know regarding restarting this medication. She states that she will continue to stay on this medication.

## 2020-02-24 NOTE — Telephone Encounter (Signed)
Patient informed. 

## 2020-02-24 NOTE — Telephone Encounter (Signed)
OK, sent.

## 2020-02-29 DIAGNOSIS — K047 Periapical abscess without sinus: Secondary | ICD-10-CM | POA: Diagnosis not present

## 2020-02-29 DIAGNOSIS — U071 COVID-19: Secondary | ICD-10-CM | POA: Diagnosis not present

## 2020-04-19 ENCOUNTER — Ambulatory Visit: Payer: Federal, State, Local not specified - PPO | Admitting: Family Medicine

## 2020-04-19 ENCOUNTER — Ambulatory Visit: Payer: Medicare Other | Admitting: Family Medicine

## 2020-04-19 DIAGNOSIS — Z0289 Encounter for other administrative examinations: Secondary | ICD-10-CM

## 2020-06-30 ENCOUNTER — Telehealth: Payer: Federal, State, Local not specified - PPO | Admitting: Family Medicine

## 2020-07-01 ENCOUNTER — Telehealth (INDEPENDENT_AMBULATORY_CARE_PROVIDER_SITE_OTHER): Payer: Medicare Other | Admitting: Family

## 2020-07-01 ENCOUNTER — Other Ambulatory Visit: Payer: Self-pay

## 2020-07-01 ENCOUNTER — Encounter: Payer: Self-pay | Admitting: Family

## 2020-07-01 ENCOUNTER — Ambulatory Visit: Payer: Medicare Other | Attending: Internal Medicine

## 2020-07-01 VITALS — BP 137/78 | HR 57 | Temp 98.1°F | Ht 64.0 in | Wt 222.0 lb

## 2020-07-01 DIAGNOSIS — J209 Acute bronchitis, unspecified: Secondary | ICD-10-CM | POA: Diagnosis not present

## 2020-07-01 DIAGNOSIS — Z20822 Contact with and (suspected) exposure to covid-19: Secondary | ICD-10-CM

## 2020-07-01 DIAGNOSIS — R051 Acute cough: Secondary | ICD-10-CM

## 2020-07-01 MED ORDER — ALBUTEROL SULFATE HFA 108 (90 BASE) MCG/ACT IN AERS: 2.0000 | INHALATION_SPRAY | Freq: Four times a day (QID) | RESPIRATORY_TRACT | 0 refills | Status: DC | PRN

## 2020-07-01 MED ORDER — PREDNISONE 20 MG PO TABS: 20.0000 mg | ORAL_TABLET | Freq: Every day | ORAL | 0 refills | Status: DC

## 2020-07-01 MED ORDER — DOXYCYCLINE HYCLATE 100 MG PO TABS: 100.0000 mg | ORAL_TABLET | Freq: Two times a day (BID) | ORAL | 0 refills | Status: DC

## 2020-07-01 NOTE — Progress Notes (Signed)
Shelly Howell is a 70 y.o. female with the following history as recorded in Epic[employer]:  Patient Active Problem List   Diagnosis Date Noted  . Chronic cough 08/31/2017    Current Outpatient Medications  Medication Sig Dispense Refill  . albuterol (VENTOLIN HFA) 108 (90 Base) MCG/ACT inhaler Inhale 2 puffs into the lungs every 6 (six) hours as needed for wheezing or shortness of breath. 8 g 0  . Cholecalciferol (VITAMIN D) 2000 units CAPS Take by mouth.    . doxycycline (VIBRA-TABS) 100 MG tablet Take 1 tablet (100 mg total) by mouth 2 (two) times daily. 14 tablet 0  . FLUoxetine (PROZAC) 20 MG tablet TAKE 1 TABLET(20 MG) BY MOUTH DAILY. 90 tablet 2  . glucosamine-chondroitin 500-400 MG tablet Take 1 tablet by mouth 3 (three) times daily.    . Multiple Vitamin (MULTIVITAMIN) tablet Take 1 tablet by mouth daily.    . Omega-3 Fatty Acids (FISH OIL PO) Take by mouth.    . predniSONE (DELTASONE) 20 MG tablet Take 1 tablet (20 mg total) by mouth daily with breakfast. 5 tablet 0   No current facility-administered medications for this visit.    Allergies: Patient has no known allergies.  Past Medical History:  Diagnosis Date  . Allergy    seasonal  . Arthritis    back  . Depression   . Migraines     Past Surgical History:  Procedure Laterality Date  . ABDOMINAL HYSTERECTOMY    . APPENDECTOMY    . DILATION AND CURETTAGE OF UTERUS    . OVARY SURGERY  06/1973   cyst on left ovary ruptured/ovary was removed    Family History  Problem Relation Age of Onset  . Cancer Mother   . Skin cancer Mother   . Alcohol abuse Father   . COPD Father   . Diabetes Father   . Cancer Sister   . Drug abuse Sister   . Heart disease Sister   . Cancer Brother   . Lung cancer Brother   . Stroke Maternal Grandmother   . Cancer Maternal Grandfather   . Healthy Sister     Social History   Tobacco Use  . Smoking status: Former Smoker    Quit date: 10/18/1980    Years since quitting: 39.7  .  Smokeless tobacco: Never Used  Substance Use Topics  . Alcohol use: Not Currently    Subjective:   I connected with Kendal Hymen on 07/01/20 at  3:20 PM EDT by a video enabled telemedicine application and verified that I am speaking with the correct person using two identifiers.   I discussed the limitations of evaluation and management by telemedicine and the availability of in person appointments. The patient expressed understanding and agreed to proceed. Provider in office/ patient is at home; provider and patient are only 2 people on video call.   Started with cough/ congestion last Friday; is prone to bronchitis and pneumonia; feels that symptoms are worsening; notes that had 2 concerning episodes of coughing fits last night; had COVID test done today- results pending;    Objective:  Vitals:   07/01/20 1514  BP: 137/78  Pulse: (!) 57  Temp: 98.1 F (36.7 C)  TempSrc: Temporal  Weight: 222 lb (100.7 kg)  Height: 5\' 4"  (1.626 m)    General: Well developed, well nourished, in no acute distress  Head: Normocephalic and atraumatic  Lungs: Respirations unlabored;  Neurologic: Alert and oriented; speech intact; face symmetrical;   Assessment:  1. Acute cough   2. Acute bronchitis, unspecified organism     Plan:  1. COVID test is pending; will go ahead and start treatment for secondary bacterial infection- symptoms have now been present for more than 5 days so even is COVID test is positive not a candidate for antivirals; Rx for Doxycycline 100 mg bid x 7 days, Prednisone 20 mg qd x 5 days, albuterol inhaler q 4-6 hours; increase fluids, rest and follow up worse, no better; will need to consider CXR if symptoms persist.  No follow-ups on file.  No orders of the defined types were placed in this encounter.   Requested Prescriptions   Signed Prescriptions Disp Refills  . doxycycline (VIBRA-TABS) 100 MG tablet 14 tablet 0    Sig: Take 1 tablet (100 mg total) by mouth 2 (two)  times daily.  Marland Kitchen albuterol (VENTOLIN HFA) 108 (90 Base) MCG/ACT inhaler 8 g 0    Sig: Inhale 2 puffs into the lungs every 6 (six) hours as needed for wheezing or shortness of breath.  . predniSONE (DELTASONE) 20 MG tablet 5 tablet 0    Sig: Take 1 tablet (20 mg total) by mouth daily with breakfast.

## 2020-07-02 ENCOUNTER — Other Ambulatory Visit: Payer: Self-pay | Admitting: Family

## 2020-07-02 LAB — SPECIMEN STATUS REPORT

## 2020-07-02 LAB — SARS-COV-2, NAA 2 DAY TAT

## 2020-07-02 LAB — NOVEL CORONAVIRUS, NAA: SARS-CoV-2, NAA: NOT DETECTED

## 2020-07-02 MED ORDER — BENZONATATE 100 MG PO CAPS: 100.0000 mg | ORAL_CAPSULE | Freq: Three times a day (TID) | ORAL | 0 refills | Status: DC | PRN

## 2020-08-12 DIAGNOSIS — Z1152 Encounter for screening for COVID-19: Secondary | ICD-10-CM | POA: Diagnosis not present

## 2020-10-26 DIAGNOSIS — S93602A Unspecified sprain of left foot, initial encounter: Secondary | ICD-10-CM | POA: Diagnosis not present

## 2020-10-26 DIAGNOSIS — M7732 Calcaneal spur, left foot: Secondary | ICD-10-CM | POA: Diagnosis not present

## 2020-10-26 DIAGNOSIS — M79672 Pain in left foot: Secondary | ICD-10-CM | POA: Diagnosis not present

## 2020-10-26 DIAGNOSIS — Z79899 Other long term (current) drug therapy: Secondary | ICD-10-CM | POA: Diagnosis not present

## 2021-01-21 ENCOUNTER — Other Ambulatory Visit: Payer: Self-pay | Admitting: Family Medicine

## 2021-01-21 DIAGNOSIS — F32A Depression, unspecified: Secondary | ICD-10-CM

## 2021-01-25 ENCOUNTER — Ambulatory Visit (INDEPENDENT_AMBULATORY_CARE_PROVIDER_SITE_OTHER): Payer: Medicare Other | Admitting: Family Medicine

## 2021-01-25 ENCOUNTER — Encounter: Payer: Self-pay | Admitting: Family Medicine

## 2021-01-25 VITALS — BP 118/71 | HR 87 | Temp 99.5°F | Ht 64.0 in | Wt 207.0 lb

## 2021-01-25 DIAGNOSIS — J40 Bronchitis, not specified as acute or chronic: Secondary | ICD-10-CM

## 2021-01-25 MED ORDER — PROMETHAZINE-DM 6.25-15 MG/5ML PO SYRP: 5.0000 mL | ORAL_SOLUTION | Freq: Four times a day (QID) | ORAL | 0 refills | Status: DC | PRN

## 2021-01-25 MED ORDER — PREDNISONE 20 MG PO TABS: 40.0000 mg | ORAL_TABLET | Freq: Every day | ORAL | 0 refills | Status: AC

## 2021-01-25 MED ORDER — ALBUTEROL SULFATE HFA 108 (90 BASE) MCG/ACT IN AERS: 2.0000 | INHALATION_SPRAY | Freq: Four times a day (QID) | RESPIRATORY_TRACT | 0 refills | Status: DC | PRN

## 2021-01-25 NOTE — Progress Notes (Signed)
Chief Complaint  Patient presents with   Cough    Congestion Headache Ear pain     Shelly Howell here for URI complaints.  Duration: 1 week  Associated symptoms: Fever (99.9 F), sinus congestion, rhinorrhea, ear pain, sore throat, wheezing, shortness of breath, and coughing Denies: sinus pain, itchy watery eyes, ear drainage, myalgia, and N/V/D, loss of taste/smell Treatment to date: Dayquil/Nyquil Sick contacts: No Tested neg for covid.   Past Medical History:  Diagnosis Date   Allergy    seasonal   Arthritis    back   Depression    Migraines     Objective BP 118/71    Pulse 87    Temp 99.5 F (37.5 C) (Oral)    Ht 5\' 4"  (1.626 m)    Wt 207 lb (93.9 kg)    SpO2 95%    BMI 35.53 kg/m  General: Awake, alert, appears stated age HEENT: AT, [place], ears patent b/l and TM's neg, nares patent w/o discharge, pharynx pink and without exudates, MMM Neck: No masses or asymmetry Heart: RRR Lungs: +diffuse exp wheezing, no accessory muscle use Psych: Age appropriate judgment and insight, normal mood and affect  Wheezy bronchitis - Plan: albuterol (VENTOLIN HFA) 108 (90 Base) MCG/ACT inhaler, predniSONE (DELTASONE) 20 MG tablet, promethazine-dextromethorphan (PROMETHAZINE-DM) 6.25-15 MG/5ML syrup  Tx as above. She will send me a message Th evening or early Fri if she is not turning the corner, would consider abx at that time. Warnings about syrup verbalized and written down. Continue to push fluids, practice good hand hygiene, cover mouth when coughing. F/u prn. If starting to experience worsening s/s's, shaking, or shortness of breath, seek immediate care. Pt voiced understanding and agreement to the plan.  [place], DO 01/25/21 10:51 AM

## 2021-01-25 NOTE — Patient Instructions (Signed)
Continue to push fluids, practice good hand hygiene, and cover your mouth if you cough. ? ?If you start having fevers, shaking or shortness of breath, seek immediate care. ? ?OK to take Tylenol 1000 mg (2 extra strength tabs) or 975 mg (3 regular strength tabs) every 6 hours as needed. ? ?Let us know if you need anything. ?

## 2021-02-11 ENCOUNTER — Other Ambulatory Visit: Payer: Self-pay | Admitting: Family Medicine

## 2021-02-11 DIAGNOSIS — J40 Bronchitis, not specified as acute or chronic: Secondary | ICD-10-CM

## 2021-02-11 MED ORDER — ALBUTEROL SULFATE HFA 108 (90 BASE) MCG/ACT IN AERS: 2.0000 | INHALATION_SPRAY | Freq: Four times a day (QID) | RESPIRATORY_TRACT | 0 refills | Status: DC | PRN

## 2021-02-16 ENCOUNTER — Other Ambulatory Visit: Payer: Self-pay

## 2021-02-16 ENCOUNTER — Encounter: Payer: Self-pay | Admitting: Family Medicine

## 2021-02-16 ENCOUNTER — Ambulatory Visit (INDEPENDENT_AMBULATORY_CARE_PROVIDER_SITE_OTHER): Payer: Medicare Other | Admitting: Family Medicine

## 2021-02-16 ENCOUNTER — Ambulatory Visit (HOSPITAL_BASED_OUTPATIENT_CLINIC_OR_DEPARTMENT_OTHER)
Admission: RE | Admit: 2021-02-16 | Discharge: 2021-02-16 | Disposition: A | Discharge status: Home / Self Care | Payer: Medicare Other | Source: Ambulatory Visit | Attending: Family Medicine | Admitting: Family Medicine

## 2021-02-16 VITALS — BP 128/86 | HR 88 | Temp 98.3°F | Ht 64.0 in

## 2021-02-16 DIAGNOSIS — R052 Subacute cough: Secondary | ICD-10-CM | POA: Diagnosis not present

## 2021-02-16 DIAGNOSIS — R059 Cough, unspecified: Secondary | ICD-10-CM | POA: Diagnosis not present

## 2021-02-16 MED ORDER — FLUTICASONE FUROATE-VILANTEROL 200-25 MCG/ACT IN AEPB: 1.0000 | INHALATION_SPRAY | Freq: Every day | RESPIRATORY_TRACT | 2 refills | Status: DC

## 2021-02-16 NOTE — Patient Instructions (Addendum)
Stay hydrated.  We will be in touch regarding your X-ray results.   Let me know if there are cost issues with the inhaler.  Hang in there.   Let us know if you need anything.

## 2021-02-16 NOTE — Progress Notes (Signed)
Chief Complaint  Patient presents with   Cough    Breathing problems   Fatigue    Kendal Hymen here for URI complaints.  Dx'd w wheezy bronchitis on 12/20, tx'd w steroids, SABA and cough syrup. Still coughing, w wheezing and having sob. Steam and albuterol do help. She had recurrent PNA while living in [place]. No fevers, myalgias. She is having some runny/stuff nose. No ST, ear pain, fevers, myalgias, sinus pain.   Past Medical History:  Diagnosis Date   Allergy    seasonal   Arthritis    back   Depression    Migraines     Objective BP 128/86    Pulse 88    Temp 98.3 F (36.8 C) (Oral)    Ht 5\' 4"  (1.626 m)    SpO2 98%    BMI 35.53 kg/m  General: Awake, alert, appears stated age 71: AT, [place], ears patent b/l and TM's neg, nares patent w/o discharge, pharynx pink and without exudates, MMM Neck: No masses or asymmetry Heart: RRR Lungs: CTAB, no accessory muscle use Psych: Age appropriate judgment and insight, normal mood and affect  Subacute cough - Plan: fluticasone furoate-vilanterol (BREO ELLIPTA) 200-25 MCG/ACT AEPB, DG Chest 2 View  LABA/ICS combo as she did well w albuterol. Ck CXR. Continue to push fluids, practice good hand hygiene, cover mouth when coughing. F/u prn. If starting to experience fevers, shaking, or shortness of breath, seek immediate care. Pt voiced understanding and agreement to the plan.  [place], DO 02/16/21 7:38 AM

## 2021-04-08 ENCOUNTER — Encounter: Payer: Self-pay | Admitting: Family Medicine

## 2021-04-11 ENCOUNTER — Ambulatory Visit: Payer: Federal, State, Local not specified - PPO

## 2021-04-27 ENCOUNTER — Other Ambulatory Visit: Payer: Self-pay

## 2021-04-27 DIAGNOSIS — J40 Bronchitis, not specified as acute or chronic: Secondary | ICD-10-CM

## 2021-04-27 MED ORDER — ALBUTEROL SULFATE HFA 108 (90 BASE) MCG/ACT IN AERS: 2.0000 | INHALATION_SPRAY | Freq: Four times a day (QID) | RESPIRATORY_TRACT | 0 refills | Status: DC | PRN

## 2021-04-28 ENCOUNTER — Other Ambulatory Visit: Payer: Self-pay

## 2021-05-10 ENCOUNTER — Encounter: Payer: Self-pay | Admitting: Family

## 2021-05-21 ENCOUNTER — Other Ambulatory Visit: Payer: Self-pay | Admitting: Family Medicine

## 2021-05-21 DIAGNOSIS — R052 Subacute cough: Secondary | ICD-10-CM

## 2021-06-27 ENCOUNTER — Encounter: Payer: Self-pay | Admitting: Family Medicine

## 2021-06-27 ENCOUNTER — Ambulatory Visit (INDEPENDENT_AMBULATORY_CARE_PROVIDER_SITE_OTHER): Payer: Medicare Other | Admitting: Family Medicine

## 2021-06-27 VITALS — BP 122/76 | HR 67 | Temp 98.0°F | Ht 64.0 in | Wt 223.5 lb

## 2021-06-27 DIAGNOSIS — Z23 Encounter for immunization: Secondary | ICD-10-CM | POA: Diagnosis not present

## 2021-06-27 DIAGNOSIS — E7800 Pure hypercholesterolemia, unspecified: Secondary | ICD-10-CM

## 2021-06-27 DIAGNOSIS — L821 Other seborrheic keratosis: Secondary | ICD-10-CM

## 2021-06-27 DIAGNOSIS — R053 Chronic cough: Secondary | ICD-10-CM

## 2021-06-27 DIAGNOSIS — F339 Major depressive disorder, recurrent, unspecified: Secondary | ICD-10-CM

## 2021-06-27 LAB — COMPREHENSIVE METABOLIC PANEL
ALT: 17 U/L (ref 0–35)
AST: 18 U/L (ref 0–37)
Albumin: 4.3 g/dL (ref 3.5–5.2)
Alkaline Phosphatase: 54 U/L (ref 39–117)
BUN: 19 mg/dL (ref 6–23)
CO2: 29 mEq/L (ref 19–32)
Calcium: 10.3 mg/dL (ref 8.4–10.5)
Chloride: 106 mEq/L (ref 96–112)
Creatinine, Ser: 0.88 mg/dL (ref 0.40–1.20)
GFR: 67.26 mL/min (ref >60.00)
Glucose, Bld: 93 mg/dL (ref 70–99)
Potassium: 4.4 mEq/L (ref 3.5–5.1)
Sodium: 140 mEq/L (ref 135–145)
Total Bilirubin: 0.5 mg/dL (ref 0.2–1.2)
Total Protein: 7 g/dL (ref 6.0–8.3)

## 2021-06-27 LAB — LIPID PANEL
Cholesterol: 204 mg/dL — ABNORMAL HIGH (ref 0–200)
HDL: 60.5 mg/dL (ref >39.00)
LDL Cholesterol: 125 mg/dL — ABNORMAL HIGH (ref 0–99)
NonHDL: 143.71
Total CHOL/HDL Ratio: 3
Triglycerides: 93 mg/dL (ref 0.0–149.0)
VLDL: 18.6 mg/dL (ref 0.0–40.0)

## 2021-06-27 NOTE — Patient Instructions (Addendum)
Take 1/2 tab of the Prozac for the next week and then go back to a full tab.  Aim to do some physical exertion for 150 minutes per week. This is typically divided into 5 days per week, 30 minutes per day. The activity should be enough to get your heart rate up. Anything is better than nothing if you have time constraints.  Don't worry about the mole.   Give [place] 2-3 business days to get the results of your labs back.   Let us know if you need anything.  EXERCISES  RANGE OF MOTION (ROM) AND STRETCHING EXERCISES - Low Back Pain Most people with lower back pain will find that their symptoms get worse with excessive bending forward (flexion) or arching at the lower back (extension). The exercises that will help resolve your symptoms will focus on the opposite motion.  If you have pain, numbness or tingling which travels down into your buttocks, leg or foot, the goal of the therapy is for these symptoms to move closer to your back and eventually resolve. Sometimes, these leg symptoms will get better, but your lower back pain may worsen. This is often an indication of progress in your rehabilitation. Be very alert to any changes in your symptoms and the activities in which you participated in the 24 hours prior to the change. Sharing this information with your caregiver will allow him or her to most efficiently treat your condition. These exercises may help you when beginning to rehabilitate your injury. Your symptoms may resolve with or without further involvement from your physician, physical therapist or athletic trainer. While completing these exercises, remember:  Restoring tissue flexibility helps normal motion to return to the joints. This allows healthier, less painful movement and activity. An effective stretch should be held for at least 30 seconds. A stretch should never be painful. You should only feel a gentle lengthening or release in the stretched tissue. FLEXION RANGE OF MOTION AND  STRETCHING EXERCISES:  STRETCH - Flexion, Single Knee to Chest  Lie on a firm bed or floor with both legs extended in front of you. Keeping one leg in contact with the floor, bring your opposite knee to your chest. Hold your leg in place by either grabbing behind your thigh or at your knee. Pull until you feel a gentle stretch in your low back. Hold 30 seconds. Slowly release your grasp and repeat the exercise with the opposite side. Repeat 2 times. Complete this exercise 3 times per week.   STRETCH - Flexion, Double Knee to Chest Lie on a firm bed or floor with both legs extended in front of you. Keeping one leg in contact with the floor, bring your opposite knee to your chest. Tense your stomach muscles to support your back and then lift your other knee to your chest. Hold your legs in place by either grabbing behind your thighs or at your knees. Pull both knees toward your chest until you feel a gentle stretch in your low back. Hold 30 seconds. Tense your stomach muscles and slowly return one leg at a time to the floor. Repeat 2 times. Complete this exercise 3 times per week.   STRETCH - Low Trunk Rotation Lie on a firm bed or floor. Keeping your legs in front of you, bend your knees so they are both pointed toward the ceiling and your feet are flat on the floor. Extend your arms out to the side. This will stabilize your upper body by keeping your shoulders  in contact with the floor. Gently and slowly drop both knees together to one side until you feel a gentle stretch in your low back. Hold for 30 seconds. Tense your stomach muscles to support your lower back as you bring your knees back to the starting position. Repeat the exercise to the other side. Repeat 2 times. Complete this exercise at least 3 times per week.   EXTENSION RANGE OF MOTION AND FLEXIBILITY EXERCISES:  STRETCH - Extension, Prone on Elbows  Lie on your stomach on the floor, a bed will be too soft. Place your palms  about shoulder width apart and at the height of your head. Place your elbows under your shoulders. If this is too painful, stack pillows under your chest. Allow your body to relax so that your hips drop lower and make contact more completely with the floor. Hold this position for 30 seconds. Slowly return to lying flat on the floor. Repeat 2 times. Complete this exercise 3 times per week.   RANGE OF MOTION - Extension, Prone Press Ups Lie on your stomach on the floor, a bed will be too soft. Place your palms about shoulder width apart and at the height of your head. Keeping your back as relaxed as possible, slowly straighten your elbows while keeping your hips on the floor. You may adjust the placement of your hands to maximize your comfort. As you gain motion, your hands will come more underneath your shoulders. Hold this position 30 seconds. Slowly return to lying flat on the floor. Repeat 2 times. Complete this exercise 3 times per week.   RANGE OF MOTION- Quadruped, Neutral Spine  Assume a hands and knees position on a firm surface. Keep your hands under your shoulders and your knees under your hips. You may place padding under your knees for comfort. Drop your head and point your tailbone toward the ground below you. This will round out your lower back like an angry cat. Hold this position for 30 seconds. Slowly lift your head and release your tail bone so that your back sags into a large arch, like an old horse. Hold this position for 30 seconds. Repeat this until you feel limber in your low back. Now, find your "sweet spot." This will be the most comfortable position somewhere between the two previous positions. This is your neutral spine. Once you have found this position, tense your stomach muscles to support your low back. Hold this position for 30 seconds. Repeat 2 times. Complete this exercise 3 times per week.   STRENGTHENING EXERCISES - Low Back Sprain These exercises may help  you when beginning to rehabilitate your injury. These exercises should be done near your "sweet spot." This is the neutral, low-back arch, somewhere between fully rounded and fully arched, that is your least painful position. When performed in this safe range of motion, these exercises can be used for people who have either a flexion or extension based injury. These exercises may resolve your symptoms with or without further involvement from your physician, physical therapist or athletic trainer. While completing these exercises, remember:  Muscles can gain both the endurance and the strength needed for everyday activities through controlled exercises. Complete these exercises as instructed by your physician, physical therapist or athletic trainer. Increase the resistance and repetitions only as guided. You may experience muscle soreness or fatigue, but the pain or discomfort you are trying to eliminate should never worsen during these exercises. If this pain does worsen, stop and make certain  you are following the directions exactly. If the pain is still present after adjustments, discontinue the exercise until you can discuss the trouble with your caregiver.  STRENGTHENING - Deep Abdominals, Pelvic Tilt  Lie on a firm bed or floor. Keeping your legs in front of you, bend your knees so they are both pointed toward the ceiling and your feet are flat on the floor. Tense your lower abdominal muscles to press your low back into the floor. This motion will rotate your pelvis so that your tail bone is scooping upwards rather than pointing at your feet or into the floor. With a gentle tension and even breathing, hold this position for 3 seconds. Repeat 2 times. Complete this exercise 3 times per week.   STRENGTHENING - Abdominals, Crunches  Lie on a firm bed or floor. Keeping your legs in front of you, bend your knees so they are both pointed toward the ceiling and your feet are flat on the floor. Cross your  arms over your chest. Slightly tip your chin down without bending your neck. Tense your abdominals and slowly lift your trunk high enough to just clear your shoulder blades. Lifting higher can put excessive stress on the lower back and does not further strengthen your abdominal muscles. Control your return to the starting position. Repeat 2 times. Complete this exercise 3 times per week.   STRENGTHENING - Quadruped, Opposite UE/LE Lift  Assume a hands and knees position on a firm surface. Keep your hands under your shoulders and your knees under your hips. You may place padding under your knees for comfort. Find your neutral spine and gently tense your abdominal muscles so that you can maintain this position. Your shoulders and hips should form a rectangle that is parallel with the floor and is not twisted. Keeping your trunk steady, lift your right hand no higher than your shoulder and then your left leg no higher than your hip. Make sure you are not holding your breath. Hold this position for 30 seconds. Continuing to keep your abdominal muscles tense and your back steady, slowly return to your starting position. Repeat with the opposite arm and leg. Repeat 2 times. Complete this exercise 3 times per week.   STRENGTHENING - Abdominals and Quadriceps, Straight Leg Raise  Lie on a firm bed or floor with both legs extended in front of you. Keeping one leg in contact with the floor, bend the other knee so that your foot can rest flat on the floor. Find your neutral spine, and tense your abdominal muscles to maintain your spinal position throughout the exercise. Slowly lift your straight leg off the floor about 6 inches for a count of 3, making sure to not hold your breath. Still keeping your neutral spine, slowly lower your leg all the way to the floor. Repeat this exercise with each leg 2 times. Complete this exercise 3 times per week.  POSTURE AND BODY MECHANICS CONSIDERATIONS - Low Back  Sprain Keeping correct posture when sitting, standing or completing your activities will reduce the stress put on different body tissues, allowing injured tissues a chance to heal and limiting painful experiences. The following are general guidelines for improved posture.  While reading these guidelines, remember: The exercises prescribed by your provider will help you have the flexibility and strength to maintain correct postures. The correct posture provides the best environment for your joints to work. All of your joints have less wear and tear when properly supported by a spine with good  posture. This means you will experience a healthier, less painful body. Correct posture must be practiced with all of your activities, especially prolonged sitting and standing. Correct posture is as important when doing repetitive low-stress activities (typing) as it is when doing a single heavy-load activity (lifting).  RESTING POSITIONS Consider which positions are most painful for you when choosing a resting position. If you have pain with flexion-based activities (sitting, bending, stooping, squatting), choose a position that allows you to rest in a less flexed posture. You would want to avoid curling into a fetal position on your side. If your pain worsens with extension-based activities (prolonged standing, working overhead), avoid resting in an extended position such as sleeping on your stomach. Most people will find more comfort when they rest with their spine in a more neutral position, neither too rounded nor too arched. Lying on a non-sagging bed on your side with a pillow between your knees, or on your back with a pillow under your knees will often provide some relief. Keep in mind, being in any one position for a prolonged period of time, no matter how correct your posture, can still lead to stiffness.  PROPER SITTING POSTURE In order to minimize stress and discomfort on your spine, you must sit with  correct posture. Sitting with good posture should be effortless for a healthy body. Returning to good posture is a gradual process. Many people can work toward this most comfortably by using various supports until they have the flexibility and strength to maintain this posture on their own. When sitting with proper posture, your ears will fall over your shoulders and your shoulders will fall over your hips. You should use the back of the chair to support your upper back. Your lower back will be in a neutral position, just slightly arched. You may place a small pillow or folded towel at the base of your lower back for  support.  When working at a desk, create an environment that supports good, upright posture. Without extra support, muscles tire, which leads to excessive strain on joints and other tissues. Keep these recommendations in mind:  CHAIR: A chair should be able to slide under your desk when your back makes contact with the back of the chair. This allows you to work closely. The chair's height should allow your eyes to be level with the upper part of your monitor and your hands to be slightly lower than your elbows.  BODY POSITION Your feet should make contact with the floor. If this is not possible, use a foot rest. Keep your ears over your shoulders. This will reduce stress on your neck and low back.  INCORRECT SITTING POSTURES  If you are feeling tired and unable to assume a healthy sitting posture, do not slouch or slump. This puts excessive strain on your back tissues, causing more damage and pain. Healthier options include: Using more support, like a lumbar pillow. Switching tasks to something that requires you to be upright or walking. Talking a brief walk. Lying down to rest in a neutral-spine position.  PROLONGED STANDING WHILE SLIGHTLY LEANING FORWARD  When completing a task that requires you to lean forward while standing in one place for a long time, place either foot up on  a stationary 2-4 inch high object to help maintain the best posture. When both feet are on the ground, the lower back tends to lose its slight inward curve. If this curve flattens (or becomes too large), then the back and  your other joints will experience too much stress, tire more quickly, and can cause pain.  CORRECT STANDING POSTURES Proper standing posture should be assumed with all daily activities, even if they only take a few moments, like when brushing your teeth. As in sitting, your ears should fall over your shoulders and your shoulders should fall over your hips. You should keep a slight tension in your abdominal muscles to brace your spine. Your tailbone should point down to the ground, not behind your body, resulting in an over-extended swayback posture.   INCORRECT STANDING POSTURES  Common incorrect standing postures include a forward head, locked knees and/or an excessive swayback. WALKING Walk with an upright posture. Your ears, shoulders and hips should all line-up.  PROLONGED ACTIVITY IN A FLEXED POSITION When completing a task that requires you to bend forward at your waist or lean over a low surface, try to find a way to stabilize 3 out of 4 of your limbs. You can place a hand or elbow on your thigh or rest a knee on the surface you are reaching across. This will provide you more stability, so that your muscles do not tire as quickly. By keeping your knees relaxed, or slightly bent, you will also reduce stress across your lower back. CORRECT LIFTING TECHNIQUES  DO : Assume a wide stance. This will provide you more stability and the opportunity to get as close as possible to the object which you are lifting. Tense your abdominals to brace your spine. Bend at the knees and hips. Keeping your back locked in a neutral-spine position, lift using your leg muscles. Lift with your legs, keeping your back straight. Test the weight of unknown objects before attempting to lift them. Try  to keep your elbows locked down at your sides in order get the best strength from your shoulders when carrying an object.   Always ask for help when lifting heavy or awkward objects. INCORRECT LIFTING TECHNIQUES DO NOT:  Lock your knees when lifting, even if it is a small object. Bend and twist. Pivot at your feet or move your feet when needing to change directions. Assume that you can safely pick up even a paperclip without proper posture.

## 2021-06-27 NOTE — Progress Notes (Signed)
Chief Complaint  Patient presents with   Annual Exam    Back and knee pain Check mole on back Cough  Off Prozac for a couple weeks and needs to restart.  Under much stress.      Subjective Shelly Howell presents for f/u anxiety/depression.  Pt is currently being treated with Prozac 20 mg/d. Forgot to take for 3 weeks and s/s's got worse.   Reports she normally does well.  No thoughts of harming self or others. No self-medication with alcohol, prescription drugs or illicit drugs. Was not exercising.  Pt is not following with a counselor/psychologist.  Patient has a history of a chronic cough.  She was recently started on Breo which she takes daily.  She has no adverse effects and remembers to rinse her mouth out after use.  Her cough is improved by nearly 50%.  She is very pleased with her progress.  She would like to go away completely.  No shortness of breath or wheezing.  Past Medical History:  Diagnosis Date   Allergy    seasonal   Arthritis    back   Depression    Migraines    Allergies as of 06/27/2021   No Known Allergies      Medication List        Accurate as of Jun 27, 2021 11:58 AM. If you have any questions, ask your nurse or doctor.          albuterol 108 (90 Base) MCG/ACT inhaler Commonly known as: VENTOLIN HFA Inhale 2 puffs into the lungs every 6 (six) hours as needed for wheezing or shortness of breath.   FISH OIL PO Take by mouth.   FLUoxetine 20 MG tablet Commonly known as: PROZAC TAKE 1 TABLET(20 MG) BY MOUTH DAILY   fluticasone furoate-vilanterol 200-25 MCG/ACT Aepb Commonly known as: BREO ELLIPTA INHALE 1 PUFF INTO THE LUNGS DAILY   glucosamine-chondroitin 500-400 MG tablet Take 1 tablet by mouth 3 (three) times daily.   multivitamin tablet Take 1 tablet by mouth daily.   Vitamin D 50 MCG (2000 UT) Caps Take by mouth.        Exam BP 122/76   Pulse 67   Temp 98 F (36.7 C) (Oral)   Ht '5\' 4"'$  (1.626 m)   Wt 223 lb 8 oz  (101.4 kg)   SpO2 99%   BMI 38.36 kg/m  General:  well developed, well nourished, in no apparent distress Skin: Over the right upper back near T7, there is a hyperpigmented and raised elliptically shaped lesion with a pasted on appearance.  It is coarse in texture without any drainage, fluctuance, induration, or TTP. Heart: RRR, no LE edema MSK: No TTP over the lumbar spine or paraspinal musculature, no TTP over the SI joint Lungs:  CTAB. No respiratory distress Psych: well oriented with normal range of affect and age-appropriate judgement/insight, alert and oriented x4.  Assessment and Plan  Depression, recurrent ([place])  Chronic cough  Pure hypercholesterolemia - Plan: Comprehensive metabolic panel, Lipid panel  Seborrheic keratosis  Need for vaccination against Streptococcus pneumoniae - Plan: Pneumococcal conjugate vaccine 20-valent (Prevnar 20)  Chronic, technically stable when she remembers to take medication.  Restart Prozac 10 mg daily for 1 week and then increase back to 20 mg daily. Chronic, acceptable stability for patient.  Continue Breo 200-25 mcg daily.  Offered referral to pulmonology which she politely declined at this time.  She will let me know if she changes her mind.  PCV  20 today. Check labs. Reassurance. F/u in 6 months or as needed. The patient voiced understanding and agreement to the plan.  [place], DO 06/27/21 11:58 AM

## 2021-06-28 ENCOUNTER — Encounter: Payer: Self-pay | Admitting: Family Medicine

## 2021-09-11 ENCOUNTER — Other Ambulatory Visit: Payer: Self-pay | Admitting: Family Medicine

## 2021-09-11 DIAGNOSIS — R052 Subacute cough: Secondary | ICD-10-CM

## 2021-10-18 ENCOUNTER — Other Ambulatory Visit: Payer: Self-pay | Admitting: Family Medicine

## 2021-10-18 DIAGNOSIS — F32A Depression, unspecified: Secondary | ICD-10-CM

## 2021-10-31 ENCOUNTER — Ambulatory Visit (INDEPENDENT_AMBULATORY_CARE_PROVIDER_SITE_OTHER): Payer: Medicare Other

## 2021-10-31 VITALS — Ht 64.0 in | Wt 220.0 lb

## 2021-10-31 DIAGNOSIS — Z0000 Encounter for general adult medical examination without abnormal findings: Secondary | ICD-10-CM | POA: Diagnosis not present

## 2021-10-31 DIAGNOSIS — Z780 Asymptomatic menopausal state: Secondary | ICD-10-CM

## 2021-10-31 DIAGNOSIS — Z1231 Encounter for screening mammogram for malignant neoplasm of breast: Secondary | ICD-10-CM

## 2021-10-31 NOTE — Patient Instructions (Signed)
Shelly Howell , Thank you for taking time to complete your Medicare Wellness Visit. I appreciate your ongoing commitment to your health goals. Please review the following plan we discussed and let me know if I can assist you in the future.   Screening recommendations/referrals: Colonoscopy: Completed 11/26/2017-Due 11/27/2027 Mammogram: Ordered today. Someone will call you to schedule. Bone Density: Ordered today. Someone will call you to schedule.  Recommended yearly ophthalmology/optometry visit for glaucoma screening and checkup Recommended yearly dental visit for hygiene and checkup  Vaccinations: Influenza vaccine: Due-May obtain vaccine at our office or your local pharmacy. Pneumococcal vaccine: up to date Tdap vaccine: up to date Shingles vaccine: Completed vaccines   Covid-19:Booster available at your local pharmacy  Advanced directives: May pick up information at your next office visit  Conditions/risks identified: See problem list  Next appointment: Follow up in one year for your annual wellness visit    Preventive Care 65 Years and Older, Female Preventive care refers to lifestyle choices and visits with your health care provider that can promote health and wellness. What does preventive care include? A yearly physical exam. This is also called an annual well check. Dental exams once or twice a year. Routine eye exams. Ask your health care provider how often you should have your eyes checked. Personal lifestyle choices, including: Daily care of your teeth and gums. Regular physical activity. Eating a healthy diet. Avoiding tobacco and drug use. Limiting alcohol use. Practicing safe sex. Taking low-dose aspirin every day. Taking vitamin and mineral supplements as recommended by your health care provider. What happens during an annual well check? The services and screenings done by your health care provider during your annual well check will depend on your age, overall  health, lifestyle risk factors, and family history of disease. Counseling  Your health care provider may ask you questions about your: Alcohol use. Tobacco use. Drug use. Emotional well-being. Home and relationship well-being. Sexual activity. Eating habits. History of falls. Memory and ability to understand (cognition). Work and work Statistician. Reproductive health. Screening  You may have the following tests or measurements: Height, weight, and BMI. Blood pressure. Lipid and cholesterol levels. These may be checked every 5 years, or more frequently if you are over 52 years old. Skin check. Lung cancer screening. You may have this screening every year starting at age 26 if you have a 30-pack-year history of smoking and currently smoke or have quit within the past 15 years. Fecal occult blood test (FOBT) of the stool. You may have this test every year starting at age 25. Flexible sigmoidoscopy or colonoscopy. You may have a sigmoidoscopy every 5 years or a colonoscopy every 10 years starting at age 54. Hepatitis C blood test. Hepatitis B blood test. Sexually transmitted disease (STD) testing. Diabetes screening. This is done by checking your blood sugar (glucose) after you have not eaten for a while (fasting). You may have this done every 1-3 years. Bone density scan. This is done to screen for osteoporosis. You may have this done starting at age 10. Mammogram. This may be done every 1-2 years. Talk to your health care provider about how often you should have regular mammograms. Talk with your health care provider about your test results, treatment options, and if necessary, the need for more tests. Vaccines  Your health care provider may recommend certain vaccines, such as: Influenza vaccine. This is recommended every year. Tetanus, diphtheria, and acellular pertussis (Tdap, Td) vaccine. You may need a Td booster every 10  years. Zoster vaccine. You may need this after age  15. Pneumococcal 13-valent conjugate (PCV13) vaccine. One dose is recommended after age 63. Pneumococcal polysaccharide (PPSV23) vaccine. One dose is recommended after age 49. Talk to your health care provider about which screenings and vaccines you need and how often you need them. This information is not intended to replace advice given to you by your health care provider. Make sure you discuss any questions you have with your health care provider. Document Released: 02/19/2015 Document Revised: 10/13/2015 Document Reviewed: 11/24/2014 Elsevier Interactive Patient Education  2017 [place] Prevention in the Home Falls can cause injuries. They can happen to people of all ages. There are many things you can do to make your home safe and to help prevent falls. What can I do on the outside of my home? Regularly fix the edges of walkways and driveways and fix any cracks. Remove anything that might make you trip as you walk through a door, such as a raised step or threshold. Trim any bushes or trees on the path to your home. Use bright outdoor lighting. Clear any walking paths of anything that might make someone trip, such as rocks or tools. Regularly check to see if handrails are loose or broken. Make sure that both sides of any steps have handrails. Any raised decks and porches should have guardrails on the edges. Have any leaves, snow, or ice cleared regularly. Use sand or salt on walking paths during winter. Clean up any spills in your garage right away. This includes oil or grease spills. What can I do in the bathroom? Use night lights. Install grab bars by the toilet and in the tub and shower. Do not use towel bars as grab bars. Use non-skid mats or decals in the tub or shower. If you need to sit down in the shower, use a plastic, non-slip stool. Keep the floor dry. Clean up any water that spills on the floor as soon as it happens. Remove soap buildup in the tub or shower  regularly. Attach bath mats securely with double-sided non-slip rug tape. Do not have throw rugs and other things on the floor that can make you trip. What can I do in the bedroom? Use night lights. Make sure that you have a light by your bed that is easy to reach. Do not use any sheets or blankets that are too big for your bed. They should not hang down onto the floor. Have a firm chair that has side arms. You can use this for support while you get dressed. Do not have throw rugs and other things on the floor that can make you trip. What can I do in the kitchen? Clean up any spills right away. Avoid walking on wet floors. Keep items that you use a lot in easy-to-reach places. If you need to reach something above you, use a strong step stool that has a grab bar. Keep electrical cords out of the way. Do not use floor polish or wax that makes floors slippery. If you must use wax, use non-skid floor wax. Do not have throw rugs and other things on the floor that can make you trip. What can I do with my stairs? Do not leave any items on the stairs. Make sure that there are handrails on both sides of the stairs and use them. Fix handrails that are broken or loose. Make sure that handrails are as long as the stairways. Check any carpeting to make sure  that it is firmly attached to the stairs. Fix any carpet that is loose or worn. Avoid having throw rugs at the top or bottom of the stairs. If you do have throw rugs, attach them to the floor with carpet tape. Make sure that you have a light switch at the top of the stairs and the bottom of the stairs. If you do not have them, ask someone to add them for you. What else can I do to help prevent falls? Wear shoes that: Do not have high heels. Have rubber bottoms. Are comfortable and fit you well. Are closed at the toe. Do not wear sandals. If you use a stepladder: Make sure that it is fully opened. Do not climb a closed stepladder. Make sure that  both sides of the stepladder are locked into place. Ask someone to hold it for you, if possible. Clearly mark and make sure that you can see: Any grab bars or handrails. First and last steps. Where the edge of each step is. Use tools that help you move around (mobility aids) if they are needed. These include: Canes. Walkers. Scooters. Crutches. Turn on the lights when you go into a dark area. Replace any light bulbs as soon as they burn out. Set up your furniture so you have a clear path. Avoid moving your furniture around. If any of your floors are uneven, fix them. If there are any pets around you, be aware of where they are. Review your medicines with your doctor. Some medicines can make you feel dizzy. This can increase your chance of falling. Ask your doctor what other things that you can do to help prevent falls. This information is not intended to replace advice given to you by your health care provider. Make sure you discuss any questions you have with your health care provider. Document Released: 11/19/2008 Document Revised: 07/01/2015 Document Reviewed: 02/27/2014 Elsevier Interactive Patient Education  2017 [employer].

## 2021-10-31 NOTE — Progress Notes (Signed)
Subjective:   Shelly Howell is a 71 y.o. female who presents for an Initial Medicare Annual Wellness Visit.  I connected with Shelly Howell today by telephone and verified that I am speaking with the correct person using two identifiers. Location patient: home Location provider: work Persons participating in the virtual visit: patient, Marine scientist.    I discussed the limitations, risks, security and privacy concerns of performing an evaluation and management service by telephone and the availability of in person appointments. I also discussed with the patient that there may be a patient responsible charge related to this service. The patient expressed understanding and verbally consented to this telephonic visit.    Interactive audio and video telecommunications were attempted between this provider and patient, however failed, due to patient having technical difficulties OR patient did not have access to video capability.  We continued and completed visit with audio only.  Some vital signs may be absent or patient reported.   Time Spent with patient on telephone encounter: 25 minutes   Review of Systems     Cardiac Risk Factors include: advanced age (>75mn, >>29women);obesity (BMI >30kg/m2)     Objective:    Today's Vitals   10/31/21 1347  Weight: 220 lb (99.8 kg)  Height: '5\' 4"'$  (1.626 m)  PainSc: 2    Body mass index is 37.76 kg/m.     10/31/2021    1:51 PM 02/16/2017    9:10 AM  Advanced Directives  Does Patient Have a Medical Advance Directive? No No    Current Medications (verified) Outpatient Encounter Medications as of 10/31/2021  Medication Sig   Cholecalciferol (VITAMIN D) 2000 units CAPS Take by mouth.   FLUoxetine (PROZAC) 20 MG tablet TAKE 1 TABLET(20 MG) BY MOUTH DAILY   fluticasone furoate-vilanterol (BREO ELLIPTA) 200-25 MCG/ACT AEPB INHALE 1 PUFF INTO THE LUNGS DAILY   glucosamine-chondroitin 500-400 MG tablet Take 1 tablet by mouth 3 (three) times daily.    Multiple Vitamin (MULTIVITAMIN) tablet Take 1 tablet by mouth daily.   Omega-3 Fatty Acids (FISH OIL PO) Take by mouth.   albuterol (VENTOLIN HFA) 108 (90 Base) MCG/ACT inhaler Inhale 2 puffs into the lungs every 6 (six) hours as needed for wheezing or shortness of breath.   No facility-administered encounter medications on file as of 10/31/2021.    Allergies (verified) Patient has no known allergies.   History: Past Medical History:  Diagnosis Date   Allergy    seasonal   Arthritis    back   Depression    Migraines    Past Surgical History:  Procedure Laterality Date   ABDOMINAL HYSTERECTOMY     APPENDECTOMY     DILATION AND CURETTAGE OF UTERUS     OVARY SURGERY  06/1973   cyst on left ovary ruptured/ovary was removed   Family History  Problem Relation Age of Onset   Cancer Mother    Skin cancer Mother    Alcohol abuse Father    COPD Father    Diabetes Father    Cancer Sister    Drug abuse Sister    Heart disease Sister    Cancer Brother    Lung cancer Brother    Stroke Maternal Grandmother    Cancer Maternal Grandfather    Healthy Sister    Social History   Socioeconomic History   Marital status: Married    Spouse name: Not on file   Number of children: Not on file   Years of education: Not on file  Highest education level: Not on file  Occupational History   Not on file  Tobacco Use   Smoking status: Former    Types: Cigarettes    Quit date: 10/18/1980    Years since quitting: 41.0   Smokeless tobacco: Never  Substance and Sexual Activity   Alcohol use: Not Currently   Drug use: Never   Sexual activity: Not on file  Other Topics Concern   Not on file  Social History Narrative   Not on file   Social Determinants of Health   Financial Resource Strain: Low Risk  (10/31/2021)   Overall Financial Resource Strain (CARDIA)    Difficulty of Paying Living Expenses: Not hard at all  Food Insecurity: No Food Insecurity (10/31/2021)   Hunger Vital Sign     Worried About Running Out of Food in the Last Year: Never true    Ran Out of Food in the Last Year: Never true  Transportation Needs: No Transportation Needs (10/31/2021)   PRAPARE - Hydrologist (Medical): No    Lack of Transportation (Non-Medical): No  Physical Activity: Inactive (10/31/2021)   Exercise Vital Sign    Days of Exercise per Week: 0 days    Minutes of Exercise per Session: 0 min  Stress: No Stress Concern Present (10/31/2021)   [place]    Feeling of Stress : Only a little  Social Connections: Socially Integrated (10/31/2021)   Social Connection and Isolation Panel [NHANES]    Frequency of Communication with Friends and Family: More than three times a week    Frequency of Social Gatherings with Friends and Family: Once a week    Attends Religious Services: More than 4 times per year    Active Member of [employer] or Organizations: Yes    Attends Archivist Meetings: 1 to 4 times per year    Marital Status: Married    Tobacco Counseling Counseling given: Not Answered   Clinical Intake:  Pre-visit preparation completed: Yes  Pain : 0-10 Pain Score: 2  Pain Type: Chronic pain Pain Location: Back Pain Onset: More than a month ago Pain Frequency: Constant     BMI - recorded: 37.76 Nutritional Status: BMI > 30  Obese Nutritional Risks: None Diabetes: No  How often do you need to have someone help you when you read instructions, pamphlets, or other written materials from your doctor or pharmacy?: 1 - Never  Diabetic?No  Interpreter Needed?: No  Information entered by :: Caroleen Hamman LPN   Activities of Daily Living    10/31/2021    1:56 PM 10/24/2021    8:44 AM  In your present state of health, do you have any difficulty performing the following activities:  Hearing? 0 1  Vision? 0 1  Difficulty concentrating or making decisions? 1 1  Walking or  climbing stairs? 0 1  Dressing or bathing? 0 0  Doing errands, shopping? 0 0  Preparing Food and eating ? N N  Using the Toilet? N Y  In the past six months, have you accidently leaked urine? Y Y  Do you have problems with loss of bowel control? N N  Managing your Medications? N N  Managing your Finances? N N  Housekeeping or managing your Housekeeping? Delane Ginger Y    Patient Care Team: Shelda Pal, DO as PCP - General (Family Medicine)  Indicate any recent Medical Services you may have received from other than [facility]  providers in the past year (date may be approximate).     Assessment:   This is a routine wellness examination for Shelly Howell.  Hearing/Vision screen Hearing Screening - Comments:: C/o mild hearing loss Vision Screening - Comments:: Last eye exam-about a year ago-  Dietary issues and exercise activities discussed: Current Exercise Habits: The patient does not participate in regular exercise at present, Exercise limited by: orthopedic condition(s)   Goals Addressed             This Visit's Progress    Patient Stated       Would like to lose some weight , increase exercise & drink more water       Depression Screen    10/31/2021    1:55 PM 06/27/2021    9:48 AM 07/01/2020    3:16 PM  PHQ 2/9 Scores  PHQ - 2 Score 1 1 0    Fall Risk    10/31/2021    1:54 PM 10/24/2021    8:44 AM 06/27/2021    9:48 AM 07/01/2020    3:15 PM  Fall Risk   Falls in the past year? 0 1 0 0  Number falls in past yr: 0 0 0 0  Injury with Fall? 0 1 0 0  Risk for fall due to :   No Fall Risks No Fall Risks  Follow up Falls prevention discussed  Falls evaluation completed Falls evaluation completed    [place]:  Any stairs in or around the home? Yes  If so, are there any without handrails? No  Home free of loose throw rugs in walkways, pet beds, electrical cords, etc? Yes  Adequate lighting in your home to reduce risk of falls? Yes    ASSISTIVE DEVICES UTILIZED TO PREVENT FALLS:  Life alert? No  Use of a cane, walker or w/c? No  Grab bars in the bathroom? No  Shower chair or bench in shower? Yes  Elevated toilet seat or a handicapped toilet? No   TIMED UP AND GO:  Was the test performed? No . Phone visit  Cognitive Function:        10/31/2021    2:04 PM  6CIT Screen  What Year? 0 points  What month? 0 points  What time? 0 points  Count back from 20 0 points  Months in reverse 0 points  Repeat phrase 0 points  Total Score 0 points    Immunizations Immunization History  Administered Date(s) Administered   Fluad Quad(high Dose 65+) 11/15/2018   Influenza-Unspecified 02/24/2020, 11/20/2020   Moderna Sars-Covid-2 Vaccination 10/11/2019, 11/07/2019, 07/19/2020   PNEUMOCOCCAL CONJUGATE-20 06/27/2021   Pneumococcal Conjugate-13 08/31/2017   Tdap 10/21/2019   Zoster Recombinat (Shingrix) 10/01/2017, 02/04/2018    TDAP status: Up to date  Flu Vaccine status: Due, Education has been provided regarding the importance of this vaccine. Advised may receive this vaccine at local pharmacy or Health Dept. Aware to provide a copy of the vaccination record if obtained from local pharmacy or Health Dept. Verbalized acceptance and understanding.  Pneumococcal vaccine status: Up to date  Covid-19 vaccine status: Information provided on how to obtain vaccines.   Qualifies for Shingles Vaccine? No   Zostavax completed No   Shingrix Completed?: Yes  Screening Tests Health Maintenance  Topic Date Due   INFLUENZA VACCINE  09/06/2021   COVID-19 Vaccine (4 - Moderna risk series) 12/28/2021 (Originally 09/13/2020)   MAMMOGRAM  12/02/2021   COLONOSCOPY (Pts 45-71yr Insurance coverage will  need to be confirmed)  11/27/2027   TETANUS/TDAP  10/20/2029   Pneumonia Vaccine 18+ Years old  Completed   DEXA SCAN  Completed   Hepatitis C Screening  Completed   Zoster Vaccines- Shingrix  Completed   HPV VACCINES  Aged Out     Health Maintenance  Health Maintenance Due  Topic Date Due   INFLUENZA VACCINE  09/06/2021    Colorectal cancer screening: Type of screening: Colonoscopy. Completed 11/26/2017. Repeat every 10 years  Mammogram status: Ordered today. Pt provided with contact info and advised to call to schedule appt.   Bone Density status: Ordered today. Pt provided with contact info and advised to call to schedule appt.  Lung Cancer Screening: (Low Dose CT Chest recommended if Age 51-80 years, 30 pack-year currently smoking OR have quit w/in 15years.) does not qualify.   Additional Screening:  Hepatitis C Screening: Completed 10/01/2017  Vision Screening: Recommended annual ophthalmology exams for early detection of glaucoma and other disorders of the eye. Is the patient up to date with their annual eye exam?  Yes  Who is the provider or what is the name of the office in which the patient attends annual eye exams? [facility]   Dental Screening: Recommended annual dental exams for proper oral hygiene  Community Resource Referral / Chronic Care Management: CRR required this visit?  No   CCM required this visit?  No      Plan:     I have personally reviewed and noted the following in the patient's chart:   Medical and social history Use of alcohol, tobacco or illicit drugs  Current medications and supplements including opioid prescriptions. Patient is not currently taking opioid prescriptions. Functional ability and status Nutritional status Physical activity Advanced directives List of other physicians Hospitalizations, surgeries, and ER visits in previous 12 months Vitals Screenings to include cognitive, depression, and falls Referrals and appointments  In addition, I have reviewed and discussed with patient certain preventive protocols, quality metrics, and best practice recommendations. A written personalized care plan for preventive services as well as general preventive  health recommendations were provided to patient.   Due to this being a telephonic visit, the after visit summary with patients personalized plan was offered to patient via mail or my-chart. Patient would like to access on my-chart.   Marta Antu, LPN   [id number]/47/[id number]  Nurse Health Advisor  Nurse Notes: None

## 2021-11-30 ENCOUNTER — Encounter: Payer: Self-pay | Admitting: Family Medicine

## 2021-12-07 ENCOUNTER — Encounter: Payer: Self-pay | Admitting: Family Medicine

## 2021-12-08 ENCOUNTER — Ambulatory Visit (INDEPENDENT_AMBULATORY_CARE_PROVIDER_SITE_OTHER): Payer: Medicare Other

## 2021-12-08 DIAGNOSIS — Z23 Encounter for immunization: Secondary | ICD-10-CM

## 2021-12-13 ENCOUNTER — Other Ambulatory Visit: Payer: Self-pay | Admitting: Family Medicine

## 2021-12-13 DIAGNOSIS — R052 Subacute cough: Secondary | ICD-10-CM

## 2022-01-31 ENCOUNTER — Ambulatory Visit (INDEPENDENT_AMBULATORY_CARE_PROVIDER_SITE_OTHER): Payer: Medicare Other | Admitting: Family Medicine

## 2022-01-31 ENCOUNTER — Encounter: Payer: Self-pay | Admitting: Family Medicine

## 2022-01-31 VITALS — BP 136/94 | HR 68 | Temp 98.5°F | Resp 16 | Wt 226.0 lb

## 2022-01-31 DIAGNOSIS — J0110 Acute frontal sinusitis, unspecified: Secondary | ICD-10-CM | POA: Diagnosis not present

## 2022-01-31 DIAGNOSIS — R030 Elevated blood-pressure reading, without diagnosis of hypertension: Secondary | ICD-10-CM

## 2022-01-31 MED ORDER — AMOXICILLIN-POT CLAVULANATE 875-125 MG PO TABS: 1.0000 | ORAL_TABLET | Freq: Two times a day (BID) | ORAL | 0 refills | Status: AC

## 2022-01-31 NOTE — Patient Instructions (Addendum)
Continue to push fluids, practice good hand hygiene, and cover your mouth if you cough.  If you start having fevers, shaking or shortness of breath, seek immediate care.  Go back on the Breo while dealing with this.   Check your blood pressures 2-3 times per week, alternating the time of day you check it. If it is high, considering waiting 1-2 minutes and rechecking. If it gets higher, your anxiety is likely creeping up and we should avoid rechecking.   I want your blood pressure less than 140 on the top and less than 90 on the bottom consistently. Both goals must be met (ie, 150/70 is too high even though the 70 on the bottom is desirable).   Let us know if you need anything.

## 2022-01-31 NOTE — Progress Notes (Signed)
Chief Complaint  Patient presents with   Cough    Complains of cough since 01/26/22   Fatigue    Complains of fatigue    Fever    Reports having fever Sunday up to [place] here for URI complaints.  Duration: 4 days; she was siphoning water out of her boat and put her mouth an a dirty outdoor hose and inhaled whatever was in the hose. Symptoms started soon afterwards Associated symptoms: Fever (101 F), sinus congestion, sinus pain, rhinorrhea, shortness of breath, and coughing Denies: itchy watery eyes, ear pain, ear drainage, sore throat, wheezing, and myalgia Treatment to date: Albuterol Sick contacts: No Covid test neg.   Past Medical History:  Diagnosis Date   Allergy    seasonal   Arthritis    back   Depression    Migraines     Objective BP (!) 136/94 (BP Location: Left Arm, Cuff Size: Large)   Pulse 68   Temp 98.5 F (36.9 C) (Oral)   Resp 16   Wt 226 lb (102.5 kg)   SpO2 97%   BMI 38.79 kg/m  General: Awake, alert, appears stated age HEENT: AT, [place], ears patent b/l and TM's neg, nares patent w/o discharge, pharynx pink and without exudates, MMM, ttp over frontal sinuses bilaterally Neck: No masses or asymmetry Heart: RRR Lungs: CTAB, no accessory muscle use Psych: Age appropriate judgment and insight, normal mood and affect  Acute frontal sinusitis, recurrence not specified - Plan: amoxicillin-clavulanate (AUGMENTIN) 875-125 MG tablet  Elevated blood pressure reading  7 days of Augmentin to cover for aspiration in addition to sinusitis.  Go back on Breo twice daily.  No wheezing, do not think she needs prednisone today.  Continue to push fluids, practice good hand hygiene, cover mouth when coughing. F/u prn. If starting to experience fevers, shaking, or shortness of breath, seek immediate care. Monitor blood pressures at home.  If greater than 140/90 consistently, she will follow-up in a month. Pt voiced understanding and agreement to the  plan.  [place], DO 01/31/22 11:56 AM

## 2022-03-15 ENCOUNTER — Other Ambulatory Visit: Payer: Self-pay | Admitting: Family Medicine

## 2022-03-15 DIAGNOSIS — R052 Subacute cough: Secondary | ICD-10-CM

## 2022-04-18 DIAGNOSIS — J45909 Unspecified asthma, uncomplicated: Secondary | ICD-10-CM | POA: Diagnosis not present

## 2022-04-18 DIAGNOSIS — Z1331 Encounter for screening for depression: Secondary | ICD-10-CM | POA: Diagnosis not present

## 2022-04-18 DIAGNOSIS — Z6838 Body mass index (BMI) 38.0-38.9, adult: Secondary | ICD-10-CM | POA: Diagnosis not present

## 2022-04-18 DIAGNOSIS — F329 Major depressive disorder, single episode, unspecified: Secondary | ICD-10-CM | POA: Diagnosis not present

## 2022-04-18 DIAGNOSIS — I10 Essential (primary) hypertension: Secondary | ICD-10-CM | POA: Diagnosis not present

## 2022-05-02 DIAGNOSIS — I10 Essential (primary) hypertension: Secondary | ICD-10-CM | POA: Diagnosis not present

## 2022-05-02 DIAGNOSIS — Z6837 Body mass index (BMI) 37.0-37.9, adult: Secondary | ICD-10-CM | POA: Diagnosis not present

## 2022-05-29 DIAGNOSIS — Z6837 Body mass index (BMI) 37.0-37.9, adult: Secondary | ICD-10-CM | POA: Diagnosis not present

## 2022-05-29 DIAGNOSIS — I10 Essential (primary) hypertension: Secondary | ICD-10-CM | POA: Diagnosis not present

## 2022-06-15 ENCOUNTER — Ambulatory Visit
Admission: RE | Admit: 2022-06-15 | Discharge: 2022-06-15 | Disposition: A | Discharge status: Home / Self Care | Payer: Medicare Other | Source: Ambulatory Visit | Attending: Family Medicine | Admitting: Family Medicine

## 2022-06-15 DIAGNOSIS — R29890 Loss of height: Secondary | ICD-10-CM | POA: Diagnosis not present

## 2022-06-15 DIAGNOSIS — E349 Endocrine disorder, unspecified: Secondary | ICD-10-CM | POA: Diagnosis not present

## 2022-06-15 DIAGNOSIS — Z1231 Encounter for screening mammogram for malignant neoplasm of breast: Secondary | ICD-10-CM | POA: Diagnosis not present

## 2022-06-15 DIAGNOSIS — N958 Other specified menopausal and perimenopausal disorders: Secondary | ICD-10-CM | POA: Diagnosis not present

## 2022-06-15 DIAGNOSIS — Z780 Asymptomatic menopausal state: Secondary | ICD-10-CM

## 2022-06-21 DIAGNOSIS — Z6837 Body mass index (BMI) 37.0-37.9, adult: Secondary | ICD-10-CM | POA: Diagnosis not present

## 2022-06-21 DIAGNOSIS — I10 Essential (primary) hypertension: Secondary | ICD-10-CM | POA: Diagnosis not present

## 2022-06-21 DIAGNOSIS — R5383 Other fatigue: Secondary | ICD-10-CM | POA: Diagnosis not present

## 2022-08-01 ENCOUNTER — Emergency Department (HOSPITAL_BASED_OUTPATIENT_CLINIC_OR_DEPARTMENT_OTHER): Payer: Medicare Other

## 2022-08-01 ENCOUNTER — Emergency Department (HOSPITAL_BASED_OUTPATIENT_CLINIC_OR_DEPARTMENT_OTHER)
Admission: EM | Admit: 2022-08-01 | Discharge: 2022-08-01 | Disposition: A | Discharge status: Home / Self Care | Payer: Medicare Other | Attending: Emergency Medicine | Admitting: Emergency Medicine

## 2022-08-01 ENCOUNTER — Encounter (HOSPITAL_BASED_OUTPATIENT_CLINIC_OR_DEPARTMENT_OTHER): Payer: Self-pay | Admitting: Emergency Medicine

## 2022-08-01 DIAGNOSIS — M79644 Pain in right finger(s): Secondary | ICD-10-CM | POA: Diagnosis present

## 2022-08-01 DIAGNOSIS — Y92410 Unspecified street and highway as the place of occurrence of the external cause: Secondary | ICD-10-CM | POA: Insufficient documentation

## 2022-08-01 DIAGNOSIS — S50311A Abrasion of right elbow, initial encounter: Secondary | ICD-10-CM | POA: Insufficient documentation

## 2022-08-01 DIAGNOSIS — S63601A Unspecified sprain of right thumb, initial encounter: Secondary | ICD-10-CM | POA: Diagnosis not present

## 2022-08-01 DIAGNOSIS — W010XXA Fall on same level from slipping, tripping and stumbling without subsequent striking against object, initial encounter: Secondary | ICD-10-CM | POA: Diagnosis not present

## 2022-08-01 DIAGNOSIS — T148XXA Other injury of unspecified body region, initial encounter: Secondary | ICD-10-CM

## 2022-08-01 MED ORDER — IBUPROFEN 400 MG PO TABS
400.0000 mg | ORAL_TABLET | Freq: Once | ORAL | Status: AC
  Administered 2022-08-01: 400 mg via ORAL
  Filled 2022-08-01: qty 1

## 2022-08-01 NOTE — Discharge Instructions (Signed)
Recommend Tylenol and ibuprofen and ice.  Use splint for comfort.  Follow-up with your primary care doctor.

## 2022-08-01 NOTE — ED Triage Notes (Signed)
Pt states stepped on edge of road and fell, injury to left elbow and thumb.

## 2022-08-01 NOTE — ED Provider Notes (Signed)
[facility] Provider Note   CSN: [id number] Arrival date & time: 08/01/22  7829     History  Chief Complaint  Patient presents with   Shelly Howell is a 72 y.o. female.  Patient here with right elbow and right thumb pain after fall yesterday.  Tripped on sidewalk has road rash to the right arm, pain to the right thumb.  No pain elsewhere.  Did not hit her head or lose consciousness.  Not on blood thinners.  The history is provided by the patient.       Home Medications Prior to Admission medications   Medication Sig Start Date End Date Taking? Authorizing Provider  Cholecalciferol (VITAMIN D) 2000 units CAPS Take by mouth.    [provider]  FLUoxetine (PROZAC) 20 MG tablet TAKE 1 TABLET(20 MG) BY MOUTH DAILY 10/18/21   Carmelia Roller, Jilda Roche, DO  fluticasone furoate-vilanterol (BREO ELLIPTA) 200-25 MCG/ACT AEPB INHALE 1 PUFF INTO THE LUNGS DAILY 03/15/22   Sharlene Dory, DO  glucosamine-chondroitin 500-400 MG tablet Take 1 tablet by mouth 3 (three) times daily.    [provider]  Multiple Vitamin (MULTIVITAMIN) tablet Take 1 tablet by mouth daily.    [provider]  Omega-3 Fatty Acids (FISH OIL PO) Take by mouth.    [provider]      Allergies    Patient has no known allergies.    Review of Systems   Review of Systems  Physical Exam Updated Vital Signs BP (!) 150/95 (BP Location: Left Arm)   Pulse 66   Temp 98.3 F (36.8 C) (Oral)   Resp 17   Ht 5\' 4"  (1.626 m)   Wt 93.4 kg   SpO2 96%   BMI 35.36 kg/m  Physical Exam Vitals and nursing note reviewed.  Constitutional:      General: She is not in acute distress.    Appearance: She is well-developed. She is not ill-appearing.  HENT:     Head: Normocephalic and atraumatic.     Nose: Nose normal.     Mouth/Throat:     Mouth: Mucous membranes are moist.  Eyes:     Extraocular Movements: Extraocular  movements intact.     Conjunctiva/sclera: Conjunctivae normal.     Pupils: Pupils are equal, round, and reactive to light.  Cardiovascular:     Rate and Rhythm: Normal rate and regular rhythm.     Heart sounds: No murmur heard. Pulmonary:     Effort: Pulmonary effort is normal. No respiratory distress.     Breath sounds: Normal breath sounds.  Abdominal:     Palpations: Abdomen is soft.     Tenderness: There is no abdominal tenderness.  Musculoskeletal:        General: Tenderness present. No swelling.     Cervical back: Normal range of motion and neck supple.     Comments: Tenderness to the distal portion of the right thumb, there is no tenderness in the anatomical snuffbox, little bit of tenderness to the right elbow  Skin:    General: Skin is warm and dry.     Capillary Refill: Capillary refill takes less than 2 seconds.     Comments: Abrasion to the right elbow/proximal forearm  Neurological:     General: No focal deficit present.     Mental Status: She is alert and oriented to person, place, and time.     Cranial Nerves: No cranial nerve  deficit.     Sensory: No sensory deficit.     Motor: No weakness.  Psychiatric:        Mood and Affect: Mood normal.     ED Results / Procedures / Treatments   Labs (all labs ordered are listed, but only abnormal results are displayed) Labs Reviewed - No data to display  EKG None  Radiology DG Finger Thumb Right  Result Date: 08/01/2022 CLINICAL DATA:  Fall injury right thumb, right elbow. EXAM: RIGHT THUMB 2+V; RIGHT ELBOW - COMPLETE 3+ VIEW COMPARISON:  None Available. FINDINGS: Right elbow, four views: There is no evidence of fracture, dislocation or joint effusion. Arthritic changes are not seen. Bone mineralization is normal. There are skin folds overlying the supracondylar area. Right thumb, three views: Normal bone mineralization. There is no evidence of fracture or dislocation. There is mild soft tissue swelling. Narrowing and  osteophytes are noted of the thumb IP joint. There is no erosive arthropathy. Other visualized joint spaces are maintained. No radiopaque foreign body. IMPRESSION: 1. No evidence of fracture or dislocation of the right elbow or right thumb. 2. 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 Electronically Signed   By: Almira Bar M.D.   On: 08/01/2022 07:42   DG Elbow Complete Right  Result Date: 08/01/2022 CLINICAL DATA:  Fall injury right thumb, right elbow. EXAM: RIGHT THUMB 2+V; RIGHT ELBOW - COMPLETE 3+ VIEW COMPARISON:  None Available. FINDINGS: Right elbow, four views: There is no evidence of fracture, dislocation or joint effusion. Arthritic changes are not seen. Bone mineralization is normal. There are skin folds overlying the supracondylar area. Right thumb, three views: Normal bone mineralization. There is no evidence of fracture or dislocation. There is mild soft tissue swelling. Narrowing and osteophytes are noted of the thumb IP joint. There is no erosive arthropathy. Other visualized joint spaces are maintained. No radiopaque foreign body. IMPRESSION: 1. No evidence of fracture or dislocation of the right elbow or right thumb. 2. 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 Electronically Signed   By: Almira Bar M.D.   On: 08/01/2022 07:42    Procedures Procedures    Medications Ordered in ED Medications - No data to display  ED Course/ Medical Decision Making/ A&P                             Medical Decision Making Amount and/or Complexity of Data Reviewed Radiology: ordered.   [name] is here with right elbow and right thumb pain after fall yesterday.  Not on blood thinners.  Road rash/abrasion to the right elbow/forearm.  Overall suspect contusion of the right thumb and right elbow.  Possibly a sprain.  Less likely fracture.  X-rays to be obtained.  X-ray per my review and interpretation with no acute fracture or malalignment.  Radiology report with the same.   Overall suspect thumb sprain.  Will place in a removable thumb spica splint for support.  Recommend Tylenol, ibuprofen, ice and rest and follow-up with primary care doctor.  Discharged in good condition.  This chart was dictated using voice recognition software.  Despite best efforts to proofread,  errors can occur which can change the documentation meaning.         Final Clinical Impression(s) / ED Diagnoses Final diagnoses:  Abrasion  Sprain of right thumb, unspecified site of digit, initial encounter    Rx / DC Orders ED Discharge Orders  None         Virgina Norfolk, DO 08/01/22 [id number]

## 2022-08-29 ENCOUNTER — Encounter: Payer: Self-pay | Admitting: Family Medicine

## 2022-10-04 ENCOUNTER — Encounter: Payer: Self-pay | Admitting: Family Medicine

## 2022-10-04 ENCOUNTER — Ambulatory Visit (INDEPENDENT_AMBULATORY_CARE_PROVIDER_SITE_OTHER): Payer: Medicare Other | Admitting: Family Medicine

## 2022-10-04 VITALS — BP 146/84 | HR 60 | Temp 98.7°F | Ht 64.0 in | Wt 203.5 lb

## 2022-10-04 DIAGNOSIS — I10 Essential (primary) hypertension: Secondary | ICD-10-CM | POA: Diagnosis not present

## 2022-10-04 DIAGNOSIS — R5383 Other fatigue: Secondary | ICD-10-CM | POA: Diagnosis not present

## 2022-10-04 MED ORDER — AMLODIPINE BESYLATE 5 MG PO TABS
5.0000 mg | ORAL_TABLET | Freq: Every day | ORAL | 3 refills | Status: DC
Start: 2022-10-04 — End: 2023-02-01

## 2022-10-04 NOTE — Progress Notes (Signed)
Chief Complaint  Patient presents with   Hypertension    Subjective Shelly Howell is a 72 y.o. female who presents for hypertension follow up. She does monitor home blood pressures. Blood pressures ranging from 140's/80's on average. She is compliant with medications. Patient has these side effects of medication: none She is usually adhering to a healthy diet overall. Current exercise: [employer] aerobic classes 3-4x/week No CP or SOB.   Patient has been experiencing associated fatigue.  She becomes very sleepy in the afternoon where she needs to take a nap.  Diet/exercise as above.  Mood has been stable on the fluoxetine.  She does not snore at night and her spouse reports no witnessed apneic episodes.  She had a sleep study a while ago showing sleep apnea but she has lost around 20 pounds.   Past Medical History:  Diagnosis Date   Allergy    seasonal   Arthritis    back   Depression    Migraines     Exam BP (!) 146/84 (BP Location: Left Arm, Cuff Size: Normal)   Pulse 60   Temp 98.7 F (37.1 C) (Oral)   Ht 5\' 4"  (1.626 m)   Wt 203 lb 8 oz (92.3 kg)   SpO2 97%   BMI 34.93 kg/m  General:  well developed, well nourished, in no apparent distress Heart: RRR, no bruits, no LE edema Lungs: clear to auscultation, no accessory muscle use Psych: well oriented with normal range of affect and appropriate judgment/insight  Essential hypertension - Plan: amLODipine (NORVASC) 5 MG tablet  Fatigue, unspecified type - Plan: CBC, Comprehensive metabolic panel, TSH  Chronic, uncontrolled.  Start amlodipine 5 mg daily.  Monitor blood pressure at home.  Counseled on diet and exercise. F/u in 1 month to recheck. Check above labs.  If normal, will refer to neurology for sleep evaluation. The patient voiced understanding and agreement to the plan.  Jilda Roche [employer], DO 10/04/22  4:52 PM

## 2022-10-04 NOTE — Patient Instructions (Addendum)
Keep the diet clean and stay active.  Give [place] 2-3 business days to get the results of your labs back.   Check your blood pressures 2-3 times per week, alternating the time of day you check it. If it is high, considering waiting 1-2 minutes and rechecking. If it gets higher, your anxiety is likely creeping up and we should avoid rechecking.   Let us know if you need anything.

## 2022-10-05 LAB — COMPREHENSIVE METABOLIC PANEL
ALT: 15 U/L (ref 0–35)
AST: 20 U/L (ref 0–37)
Albumin: 4.1 g/dL (ref 3.5–5.2)
Alkaline Phosphatase: 55 U/L (ref 39–117)
BUN: 30 mg/dL — ABNORMAL HIGH (ref 6–23)
CO2: 23 meq/L (ref 19–32)
Calcium: 11 mg/dL — ABNORMAL HIGH (ref 8.4–10.5)
Chloride: 106 meq/L (ref 96–112)
Creatinine, Ser: 0.91 mg/dL (ref 0.40–1.20)
GFR: 64.03 mL/min (ref >60.00)
Glucose, Bld: 73 mg/dL (ref 70–99)
Potassium: 4 meq/L (ref 3.5–5.1)
Sodium: 137 meq/L (ref 135–145)
Total Bilirubin: 0.4 mg/dL (ref 0.2–1.2)
Total Protein: 7.2 g/dL (ref 6.0–8.3)

## 2022-10-05 LAB — TSH: TSH: 1.55 u[IU]/mL (ref 0.35–5.50)

## 2022-10-05 LAB — CBC
HCT: 37.2 % (ref 36.0–46.0)
Hemoglobin: 11.9 g/dL — ABNORMAL LOW (ref 12.0–15.0)
MCHC: 32 g/dL (ref 30.0–36.0)
MCV: 88.6 fl (ref 78.0–100.0)
Platelets: 237 10*3/uL (ref 150.0–400.0)
RBC: 4.2 Mil/uL (ref 3.87–5.11)
RDW: 14.4 % (ref 11.5–15.5)
WBC: 5.6 10*3/uL (ref 4.0–10.5)

## 2022-10-06 ENCOUNTER — Other Ambulatory Visit: Payer: Self-pay | Admitting: Family Medicine

## 2022-10-06 ENCOUNTER — Ambulatory Visit (INDEPENDENT_AMBULATORY_CARE_PROVIDER_SITE_OTHER): Payer: Medicare Other

## 2022-10-06 DIAGNOSIS — D649 Anemia, unspecified: Secondary | ICD-10-CM | POA: Diagnosis not present

## 2022-10-06 LAB — IBC + FERRITIN
Ferritin: 51.4 ng/mL (ref 10.0–291.0)
Iron: 46 ug/dL (ref 42–145)
Saturation Ratios: 14.2 % — ABNORMAL LOW (ref 20.0–50.0)
TIBC: 323.4 ug/dL (ref 250.0–450.0)
Transferrin: 231 mg/dL (ref 212.0–360.0)

## 2022-10-07 LAB — PARATHYROID HORMONE, INTACT (NO CA): PTH: 66 pg/mL (ref 16–77)

## 2022-10-10 ENCOUNTER — Other Ambulatory Visit: Payer: Self-pay | Admitting: Family Medicine

## 2022-10-10 DIAGNOSIS — E213 Hyperparathyroidism, unspecified: Secondary | ICD-10-CM

## 2022-10-13 ENCOUNTER — Encounter: Payer: Self-pay | Admitting: Family Medicine

## 2022-10-23 ENCOUNTER — Other Ambulatory Visit: Payer: Self-pay | Admitting: Family Medicine

## 2022-10-23 NOTE — Addendum Note (Signed)
Addended by: Scharlene Gloss B on: 10/23/2022 07:39 AM   Modules accepted: Orders

## 2022-11-06 ENCOUNTER — Ambulatory Visit (INDEPENDENT_AMBULATORY_CARE_PROVIDER_SITE_OTHER): Payer: Medicare Other | Admitting: Family Medicine

## 2022-11-06 ENCOUNTER — Ambulatory Visit (INDEPENDENT_AMBULATORY_CARE_PROVIDER_SITE_OTHER): Payer: Medicare Other | Admitting: *Deleted

## 2022-11-06 ENCOUNTER — Encounter: Payer: Self-pay | Admitting: Family Medicine

## 2022-11-06 VITALS — BP 134/74 | HR 55 | Temp 98.8°F | Ht 64.0 in | Wt 204.4 lb

## 2022-11-06 VITALS — BP 134/74 | HR 55 | Ht 64.0 in | Wt 204.4 lb

## 2022-11-06 DIAGNOSIS — Z23 Encounter for immunization: Secondary | ICD-10-CM | POA: Diagnosis not present

## 2022-11-06 DIAGNOSIS — Z0000 Encounter for general adult medical examination without abnormal findings: Secondary | ICD-10-CM | POA: Diagnosis not present

## 2022-11-06 DIAGNOSIS — F339 Major depressive disorder, recurrent, unspecified: Secondary | ICD-10-CM

## 2022-11-06 DIAGNOSIS — I10 Essential (primary) hypertension: Secondary | ICD-10-CM | POA: Diagnosis not present

## 2022-11-06 MED ORDER — FLUOXETINE HCL 40 MG PO CAPS
40.0000 mg | ORAL_CAPSULE | Freq: Every day | ORAL | 3 refills | Status: DC
Start: 2022-11-06 — End: 2023-03-05

## 2022-11-06 NOTE — Progress Notes (Signed)
Chief Complaint  Patient presents with   Follow-up    Subjective Shelly Howell is a 72 y.o. female who presents for hypertension follow up. She does monitor home blood pressures. Blood pressures ranging from 130's/70's on average. She is compliant with medication-Norvasc 5 mg/d. Patient has these side effects of medication: none She is usually adhering to a healthy diet overall. Current exercise: walking, workout classes No CP or SOB.    Depression Over the past month, the patient been having worsening depression, tearfulness, anhedonia, and irritability.  This stems from a large argument with her mother after she had to fly to [place] as her father was hospitalized.  No homicidal or suicidal ideation.  No self-medication.  The patient does not follow with a therapist or counselor.  She takes Prozac 20 mg daily which was working well prior to this.  She reports compliance and no adverse effects.  Past Medical History:  Diagnosis Date   Allergy    seasonal   Arthritis    back   COPD (chronic obstructive pulmonary disease) (HCC) 1998   Depression    Hypertension 2020   Migraines    Sleep apnea 2000    Exam BP 134/74 (BP Location: Right Arm, Patient Position: Sitting, Cuff Size: Normal)   Pulse (!) 55   Temp 98.8 F (37.1 C) (Oral)   Ht 5\' 4"  (1.626 m)   Wt 204 lb 6 oz (92.7 kg)   SpO2 94%   BMI 35.08 kg/m  General:  well developed, well nourished, in no apparent distress Heart: Regular rhythm, bradycardic, no bruits, no LE edema Lungs: clear to auscultation, no accessory muscle use Psych: well oriented with normal range of affect and appropriate judgment/insight, did become tearful during exam  Essential hypertension  Depression, recurrent (HCC) - Plan: FLUoxetine (PROZAC) 40 MG capsule  Chronic, stable. Cont Norvasc 5 mg/d. Counseled on diet and exercise. Chronic, unstable.  Counseling information provided.  Counseled on exercise.  Increase Prozac from 20 mg daily  to 40 mg daily. F/u in 1 month. The patient voiced understanding and agreement to the plan.  Jilda Roche [place], [place] 11/06/22  4:33 PM

## 2022-11-06 NOTE — Patient Instructions (Signed)
Keep the diet clean and stay active.  Aim to do some physical exertion for 150 minutes per week. This is typically divided into 5 days per week, 30 minutes per day. The activity should be enough to get your heart rate up. Anything is better than nothing if you have time constraints.  Please consider counseling. Contact [phone] to schedule an appointment or inquire about cost/insurance coverage.  [facility] located at [address], [place], [place].  Phone number = [phone].  Dr. Regan Lemming - Adult Psychiatry.    [facility] located at [address] [place], [place], [place]. Phone number = [phone].   [facility] located at [address] [place], [place].  Phone number = [phone].   [facility] located at [address] [place], [place], [place].  Phone number = [phone].  Let us know if you need anything.

## 2022-11-06 NOTE — Progress Notes (Signed)
Subjective:   Shelly Howell is a 72 y.o. female who presents for Medicare Annual (Subsequent) preventive examination.  Visit Complete: In person  Patient Medicare AWV questionnaire was completed by the patient on 11/02/22; I have confirmed that all information answered by patient is correct and no changes since this date.  Cardiac Risk Factors include: advanced age (>70men, >59 women);obesity (BMI >30kg/m2);hypertension     Objective:    Today's Vitals   11/06/22 1504  BP: 134/74  Pulse: (!) 55  Weight: 204 lb 6.4 oz (92.7 kg)  Height: 5\' 4"  (1.626 m)   Body mass index is 35.09 kg/m.     11/06/2022    3:12 PM 08/01/2022    7:06 AM 10/31/2021    1:51 PM 02/16/2017    9:10 AM  Advanced Directives  Does Patient Have a Medical Advance Directive? No No No No  Would patient like information on creating a medical advance directive? No - Patient declined       Current Medications (verified) Outpatient Encounter Medications as of 11/06/2022  Medication Sig   amLODipine (NORVASC) 5 MG tablet Take 1 tablet (5 mg total) by mouth daily.   Cholecalciferol (VITAMIN D) 2000 units CAPS Take by mouth.   FLUoxetine (PROZAC) 20 MG tablet TAKE 1 TABLET(20 MG) BY MOUTH DAILY   glucosamine-chondroitin 500-400 MG tablet Take 1 tablet by mouth 3 (three) times daily.   Multiple Vitamin (MULTIVITAMIN) tablet Take 1 tablet by mouth daily.   Omega-3 Fatty Acids (FISH OIL PO) Take by mouth.   No facility-administered encounter medications on file as of 11/06/2022.    Allergies (verified) Patient has no known allergies.   History: Past Medical History:  Diagnosis Date   Allergy    seasonal   Arthritis    back   COPD (chronic obstructive pulmonary disease) (HCC) 1998   Depression    Hypertension 2020   Migraines    Sleep apnea 2000   Past Surgical History:  Procedure Laterality Date   ABDOMINAL HYSTERECTOMY     APPENDECTOMY     DILATION AND CURETTAGE OF UTERUS     OVARY SURGERY   06/1973   cyst on left ovary ruptured/ovary was removed   Family History  Problem Relation Age of Onset   Cancer Mother    Skin cancer Mother    Alcohol abuse Father    COPD Father    Diabetes Father    Cancer Sister    Drug abuse Sister    Heart disease Sister    Obesity Sister    Cancer Brother    Lung cancer Brother    Stroke Maternal Grandmother    Cancer Maternal Grandfather    Healthy Sister    Alcohol abuse Daughter    Social History   Socioeconomic History   Marital status: Married    Spouse name: Not on file   Number of children: Not on file   Years of education: Not on file   Highest education level: Some college, no degree  Occupational History   Not on file  Tobacco Use   Smoking status: Former    Current packs/day: 0.00    Types: Cigarettes    Quit date: 10/18/1980    Years since quitting: 42.0   Smokeless tobacco: Never  Substance and Sexual Activity   Alcohol use: Not Currently   Drug use: Never   Sexual activity: Yes    Birth control/protection: Post-menopausal  Other Topics Concern   Not on file  Social History Narrative   Not on file   Social Determinants of Health   Financial Resource Strain: Low Risk  (11/02/2022)   Overall Financial Resource Strain (CARDIA)    Difficulty of Paying Living Expenses: Not hard at all  Food Insecurity: No Food Insecurity (11/02/2022)   Hunger Vital Sign    Worried About Running Out of Food in the Last Year: Never true    Ran Out of Food in the Last Year: Never true  Transportation Needs: No Transportation Needs (11/02/2022)   PRAPARE - Administrator, Civil Service (Medical): No    Lack of Transportation (Non-Medical): No  Physical Activity: Sufficiently Active (11/02/2022)   Exercise Vital Sign    Days of Exercise per Week: 3 days    Minutes of Exercise per Session: 50 min  Stress: No Stress Concern Present (11/02/2022)   [employer] of Occupational Health - Occupational Stress  Questionnaire    Feeling of Stress : Only a little  Social Connections: Socially Integrated (11/02/2022)   Social Connection and Isolation Panel [NHANES]    Frequency of Communication with Friends and Family: Twice a week    Frequency of Social Gatherings with Friends and Family: Once a week    Attends Religious Services: More than 4 times per year    Active Member of [employer] or Organizations: Yes    Attends Engineer, structural: More than 4 times per year    Marital Status: Married    Tobacco Counseling Counseling given: Not Answered   Clinical Intake:  Pre-visit preparation completed: Yes  Pain : No/denies pain     BMI - recorded: 35.09 Nutritional Status: BMI > 30  Obese Nutritional Risks: None Diabetes: No  How often do you need to have someone help you when you read instructions, pamphlets, or other written materials from your doctor or pharmacy?: 1 - Never  Interpreter Needed?: No  Information entered by :: Donne Anon, CMA   Activities of Daily Living    11/02/2022   12:30 PM  In your present state of health, do you have any difficulty performing the following activities:  Hearing? 0  Vision? 1  Difficulty concentrating or making decisions? 0  Walking or climbing stairs? 0  Dressing or bathing? 0  Doing errands, shopping? 0  Preparing Food and eating ? N  Using the Toilet? N  In the past six months, have you accidently leaked urine? Y  Do you have problems with loss of bowel control? N  Managing your Medications? N  Managing your Finances? N  Housekeeping or managing your Housekeeping? N    Patient Care Team: Sharlene Dory, DO as PCP - General (Family Medicine)  Indicate any recent Medical Services you may have received from other than [facility] providers in the past year (date may be approximate).     Assessment:   This is a routine wellness examination for Shelly Howell.  Hearing/Vision screen No results found.   Goals Addressed    None    Depression Screen    11/06/2022    3:13 PM 10/31/2021    1:55 PM 06/27/2021    9:48 AM 07/01/2020    3:16 PM  PHQ 2/9 Scores  PHQ - 2 Score 3 1 1  0  PHQ- 9 Score 8       Fall Risk    11/02/2022   12:30 PM 10/31/2021    1:54 PM 10/24/2021    8:44 AM 06/27/2021    9:48 AM 07/01/2020  3:15 PM  Fall Risk   Falls in the past year? 1 0 1 0 0  Comment tripped over new pavement      Number falls in past yr: 0 0 0 0 0  Injury with Fall? 1 0 1 0 0  Risk for fall due to : History of fall(s)   No Fall Risks No Fall Risks  Follow up Falls evaluation completed Falls prevention discussed  Falls evaluation completed Falls evaluation completed    MEDICARE RISK AT HOME: Medicare Risk at Home Any stairs in or around the home?: Yes If so, are there any without handrails?: No Home free of loose throw rugs in walkways, pet beds, electrical cords, etc?: Yes Adequate lighting in your home to reduce risk of falls?: Yes Life alert?: No Use of a cane, walker or w/c?: No Grab bars in the bathroom?: No Shower chair or bench in shower?: Yes Elevated toilet seat or a handicapped toilet?: No  TIMED UP AND GO:  Was the test performed?  Yes  Length of time to ambulate 10 feet: 5 sec Gait steady and fast without use of assistive device    Cognitive Function:        11/06/2022    3:22 PM 10/31/2021    2:04 PM  6CIT Screen  What Year? 0 points 0 points  What month? 0 points 0 points  What time? 0 points 0 points  Count back from 20 0 points 0 points  Months in reverse 0 points 0 points  Repeat phrase 0 points 0 points  Total Score 0 points 0 points    Immunizations Immunization History  Administered Date(s) Administered   Fluad Quad(high Dose 65+) 11/15/2018, 12/08/2021   Fluad Trivalent(High Dose 65+) 11/06/2022   Influenza-Unspecified 02/24/2020, 11/20/2020   Moderna Sars-Covid-2 Vaccination 10/11/2019, 11/07/2019, 07/19/2020   PNEUMOCOCCAL CONJUGATE-20 06/27/2021    Pneumococcal Conjugate-13 08/31/2017   Tdap 10/21/2019   Zoster Recombinant(Shingrix) 10/01/2017, 02/04/2018    TDAP status: Up to date  Flu Vaccine status: Completed at today's visit  Pneumococcal vaccine status: Up to date  Covid-19 vaccine status: Information provided on how to obtain vaccines.   Qualifies for Shingles Vaccine? Yes   Zostavax completed No   Shingrix Completed?: Yes  Screening Tests Health Maintenance  Topic Date Due   COVID-19 Vaccine (4 - 2023-24 season) 10/08/2022   Medicare Annual Wellness (AWV)  11/01/2022   MAMMOGRAM  06/14/2024   Colonoscopy  11/27/2027   DTaP/Tdap/Td (2 - Td or Tdap) 10/20/2029   Pneumonia Vaccine 22+ Years old  Completed   INFLUENZA VACCINE  Completed   DEXA SCAN  Completed   Hepatitis C Screening  Completed   Zoster Vaccines- Shingrix  Completed   HPV VACCINES  Aged Out    Health Maintenance  Health Maintenance Due  Topic Date Due   COVID-19 Vaccine (4 - 2023-24 season) 10/08/2022   Medicare Annual Wellness (AWV)  11/01/2022    Colorectal cancer screening: Type of screening: Colonoscopy. Completed 11/26/17. Repeat every 10 years  Mammogram status: Completed 06/15/22. Repeat every year  Bone Density status: Completed 06/15/22. Results reflect: Bone density results: NORMAL. Repeat every 2 years.  Lung Cancer Screening: (Low Dose CT Chest recommended if Age 54-80 years, 20 pack-year currently smoking OR have quit w/in 15years.) does not qualify.   Additional Screening:  Hepatitis C Screening: does qualify; Completed 10/01/17  Vision Screening: Recommended annual ophthalmology exams for early detection of glaucoma and other disorders of the eye. Is the patient  up to date with their annual eye exam?  Yes  Who is the provider or what is the name of the office in which the patient attends annual eye exams? [facility] If pt is not established with a provider, would they like to be referred to a provider to establish care? No  .   Dental Screening: Recommended annual dental exams for proper oral hygiene  Diabetic Foot Exam: N/a  Community Resource Referral / Chronic Care Management: CRR required this visit?  No   CCM required this visit?  No     Plan:     I have personally reviewed and noted the following in the patient's chart:   Medical and social history Use of alcohol, tobacco or illicit drugs  Current medications and supplements including opioid prescriptions. Patient is not currently taking opioid prescriptions. Functional ability and status Nutritional status Physical activity Advanced directives List of other physicians Hospitalizations, surgeries, and ER visits in previous 12 months Vitals Screenings to include cognitive, depression, and falls Referrals and appointments  In addition, I have reviewed and discussed with patient certain preventive protocols, quality metrics, and best practice recommendations. A written personalized care plan for preventive services as well as general preventive health recommendations were provided to patient.     Donne Anon, CMA   11/06/2022   After Visit Summary: Sent to mychart.  Nurse Notes: None

## 2022-11-06 NOTE — Patient Instructions (Signed)
Ms. Bills , Thank you for taking time to come for your Medicare Wellness Visit. I appreciate your ongoing commitment to your health goals. Please review the following plan we discussed and let me know if I can assist you in the future.     This is a list of the screening recommended for you and due dates:  Health Maintenance  Topic Date Due   COVID-19 Vaccine (4 - 2023-24 season) 10/08/2022   Medicare Annual Wellness Visit  11/06/2023   Mammogram  06/14/2024   Colon Cancer Screening  11/27/2027   DTaP/Tdap/Td vaccine (2 - Td or Tdap) 10/20/2029   Pneumonia Vaccine  Completed   Flu Shot  Completed   DEXA scan (bone density measurement)  Completed   Hepatitis C Screening  Completed   Zoster (Shingles) Vaccine  Completed   HPV Vaccine  Aged Out    Next appointment: Follow up in one year for your annual wellness visit    Preventive Care 65 Years and Older, Female Preventive care refers to lifestyle choices and visits with your health care provider that can promote health and wellness. What does preventive care include? A yearly physical exam. This is also called an annual well check. Dental exams once or twice a year. Routine eye exams. Ask your health care provider how often you should have your eyes checked. Personal lifestyle choices, including: Daily care of your teeth and gums. Regular physical activity. Eating a healthy diet. Avoiding tobacco and drug use. Limiting alcohol use. Practicing safe sex. Taking low-dose aspirin every day. Taking vitamin and mineral supplements as recommended by your health care provider. What happens during an annual well check? The services and screenings done by your health care provider during your annual well check will depend on your age, overall health, lifestyle risk factors, and family history of disease. Counseling  Your health care provider may ask you questions about your: Alcohol use. Tobacco use. Drug use. Emotional  well-being. Home and relationship well-being. Sexual activity. Eating habits. History of falls. Memory and ability to understand (cognition). Work and work Astronomer. Reproductive health. Screening  You may have the following tests or measurements: Height, weight, and BMI. Blood pressure. Lipid and cholesterol levels. These may be checked every 5 years, or more frequently if you are over 75 years old. Skin check. Lung cancer screening. You may have this screening every year starting at age 72 if you have a 30-pack-year history of smoking and currently smoke or have quit within the past 15 years. Fecal occult blood test (FOBT) of the stool. You may have this test every year starting at age 75. Flexible sigmoidoscopy or colonoscopy. You may have a sigmoidoscopy every 5 years or a colonoscopy every 10 years starting at age 39. Hepatitis C blood test. Hepatitis B blood test. Sexually transmitted disease (STD) testing. Diabetes screening. This is done by checking your blood sugar (glucose) after you have not eaten for a while (fasting). You may have this done every 1-3 years. Bone density scan. This is done to screen for osteoporosis. You may have this done starting at age 11. Mammogram. This may be done every 1-2 years. Talk to your health care provider about how often you should have regular mammograms. Talk with your health care provider about your test results, treatment options, and if necessary, the need for more tests. Vaccines  Your health care provider may recommend certain vaccines, such as: Influenza vaccine. This is recommended every year. Tetanus, diphtheria, and acellular pertussis (Tdap, Td)  vaccine. You may need a Td booster every 10 years. Zoster vaccine. You may need this after age 34. Pneumococcal 13-valent conjugate (PCV13) vaccine. One dose is recommended after age 102. Pneumococcal polysaccharide (PPSV23) vaccine. One dose is recommended after age 52. Talk to your  health care provider about which screenings and vaccines you need and how often you need them. This information is not intended to replace advice given to you by your health care provider. Make sure you discuss any questions you have with your health care provider. Document Released: 02/19/2015 Document Revised: 10/13/2015 Document Reviewed: 11/24/2014 Elsevier Interactive Patient Education  2017 [employer].  Fall Prevention in the Home Falls can cause injuries. They can happen to people of all ages. There are many things you can do to make your home safe and to help prevent falls. What can I do on the outside of my home? Regularly fix the edges of walkways and driveways and fix any cracks. Remove anything that might make you trip as you walk through a door, such as a raised step or threshold. Trim any bushes or trees on the path to your home. Use bright outdoor lighting. Clear any walking paths of anything that might make someone trip, such as rocks or tools. Regularly check to see if handrails are loose or broken. Make sure that both sides of any steps have handrails. Any raised decks and porches should have guardrails on the edges. Have any leaves, snow, or ice cleared regularly. Use sand or salt on walking paths during winter. Clean up any spills in your garage right away. This includes oil or grease spills. What can I do in the bathroom? Use night lights. Install grab bars by the toilet and in the tub and shower. Do not use towel bars as grab bars. Use non-skid mats or decals in the tub or shower. If you need to sit down in the shower, use a plastic, non-slip stool. Keep the floor dry. Clean up any water that spills on the floor as soon as it happens. Remove soap buildup in the tub or shower regularly. Attach bath mats securely with double-sided non-slip rug tape. Do not have throw rugs and other things on the floor that can make you trip. What can I do in the bedroom? Use night  lights. Make sure that you have a light by your bed that is easy to reach. Do not use any sheets or blankets that are too big for your bed. They should not hang down onto the floor. Have a firm chair that has side arms. You can use this for support while you get dressed. Do not have throw rugs and other things on the floor that can make you trip. What can I do in the kitchen? Clean up any spills right away. Avoid walking on wet floors. Keep items that you use a lot in easy-to-reach places. If you need to reach something above you, use a strong step stool that has a grab bar. Keep electrical cords out of the way. Do not use floor polish or wax that makes floors slippery. If you must use wax, use non-skid floor wax. Do not have throw rugs and other things on the floor that can make you trip. What can I do with my stairs? Do not leave any items on the stairs. Make sure that there are handrails on both sides of the stairs and use them. Fix handrails that are broken or loose. Make sure that handrails are as long  as the stairways. Check any carpeting to make sure that it is firmly attached to the stairs. Fix any carpet that is loose or worn. Avoid having throw rugs at the top or bottom of the stairs. If you do have throw rugs, attach them to the floor with carpet tape. Make sure that you have a light switch at the top of the stairs and the bottom of the stairs. If you do not have them, ask someone to add them for you. What else can I do to help prevent falls? Wear shoes that: Do not have high heels. Have rubber bottoms. Are comfortable and fit you well. Are closed at the toe. Do not wear sandals. If you use a stepladder: Make sure that it is fully opened. Do not climb a closed stepladder. Make sure that both sides of the stepladder are locked into place. Ask someone to hold it for you, if possible. Clearly mark and make sure that you can see: Any grab bars or handrails. First and last  steps. Where the edge of each step is. Use tools that help you move around (mobility aids) if they are needed. These include: Canes. Walkers. Scooters. Crutches. Turn on the lights when you go into a dark area. Replace any light bulbs as soon as they burn out. Set up your furniture so you have a clear path. Avoid moving your furniture around. If any of your floors are uneven, fix them. If there are any pets around you, be aware of where they are. Review your medicines with your doctor. Some medicines can make you feel dizzy. This can increase your chance of falling. Ask your doctor what other things that you can do to help prevent falls. This information is not intended to replace advice given to you by your health care provider. Make sure you discuss any questions you have with your health care provider. Document Released: 11/19/2008 Document Revised: 07/01/2015 Document Reviewed: 02/27/2014 Elsevier Interactive Patient Education  2017 [employer].

## 2022-12-06 ENCOUNTER — Ambulatory Visit (INDEPENDENT_AMBULATORY_CARE_PROVIDER_SITE_OTHER): Payer: Medicare Other | Admitting: Family Medicine

## 2022-12-06 ENCOUNTER — Encounter: Payer: Self-pay | Admitting: Family Medicine

## 2022-12-06 VITALS — BP 118/78 | HR 53 | Resp 18 | Ht 64.0 in | Wt 197.8 lb

## 2022-12-06 DIAGNOSIS — F339 Major depressive disorder, recurrent, unspecified: Secondary | ICD-10-CM | POA: Diagnosis not present

## 2022-12-06 NOTE — Progress Notes (Signed)
Chief Complaint  Patient presents with   med check    Subjective TAMILIA STOYER presents for f/u depression.  Pt is currently being treated with Prozac 40 mg/d.  Reports no improvement since treatment. No thoughts of harming self or others. No self-medication with alcohol, prescription drugs or illicit drugs. Pt is not following with a counselor/psychologist.  Past Medical History:  Diagnosis Date   Allergy    seasonal   Arthritis    back   COPD (chronic obstructive pulmonary disease) (HCC) 1998   Depression    Hypertension 2020   Migraines    Sleep apnea 2000   Allergies as of 12/06/2022   No Known Allergies      Medication List        Accurate as of December 06, 2022  9:59 AM. If you have any questions, ask your nurse or doctor.          amLODipine 5 MG tablet Commonly known as: NORVASC Take 1 tablet (5 mg total) by mouth daily.   FISH OIL PO Take by mouth.   FLUoxetine 40 MG capsule Commonly known as: PROZAC Take 1 capsule (40 mg total) by mouth daily.   glucosamine-chondroitin 500-400 MG tablet Take 1 tablet by mouth 3 (three) times daily.   multivitamin tablet Take 1 tablet by mouth daily.   Vitamin D 50 MCG (2000 UT) Caps Take by mouth.        Exam BP 118/78 (BP Location: Left Arm, Patient Position: Sitting, Cuff Size: Large)   Pulse (!) 53   Resp 18   Ht 5\' 4"  (1.626 m)   Wt 197 lb 12.8 oz (89.7 kg)   SpO2 97%   BMI 33.95 kg/m  General:  well developed, well nourished, in no apparent distress Lungs:  No respiratory distress Psych: Did become tearful during exam, age-appropriate judgement/insight, alert and oriented x4.  Assessment and Plan  Depression, recurrent (HCC)  Chronic, unstable.  We will continue Prozac 40 mg daily for now.  Counseling information provided.  Counseled on exercise.  She would like to give it more time before trying a different medication or adding something.  She will let me know if she changes her  mind. F/u as originally scheduled. The patient voiced understanding and agreement to the plan.  Jilda Roche [place], DO 12/06/22 9:59 AM

## 2022-12-06 NOTE — Patient Instructions (Signed)
Stay active.   Let me know if you would like to make a change.   Please consider counseling. Contact [phone] to schedule an appointment or inquire about cost/insurance coverage.  [facility] located at [address], [place], [place].  Phone number = [phone].  Dr. Regan Lemming - Adult Psychiatry.    [facility] located at [address] [place], [place], [place]. Phone number = [phone].   [facility] located at [address] [place], [place].  Phone number = [phone].   [facility] located at [address] [place], [place], [place].  Phone number = [phone].  Let us know if you need anything.

## 2023-02-01 ENCOUNTER — Other Ambulatory Visit: Payer: Self-pay | Admitting: Family Medicine

## 2023-02-01 DIAGNOSIS — I10 Essential (primary) hypertension: Secondary | ICD-10-CM

## 2023-03-05 ENCOUNTER — Other Ambulatory Visit: Payer: Self-pay | Admitting: Family Medicine

## 2023-03-05 DIAGNOSIS — F339 Major depressive disorder, recurrent, unspecified: Secondary | ICD-10-CM

## 2023-05-02 ENCOUNTER — Other Ambulatory Visit: Payer: Self-pay | Admitting: Family Medicine

## 2023-05-02 DIAGNOSIS — Z1231 Encounter for screening mammogram for malignant neoplasm of breast: Secondary | ICD-10-CM

## 2023-05-07 ENCOUNTER — Ambulatory Visit (INDEPENDENT_AMBULATORY_CARE_PROVIDER_SITE_OTHER): Admitting: Family Medicine

## 2023-05-07 ENCOUNTER — Encounter: Payer: Self-pay | Admitting: Family Medicine

## 2023-05-07 VITALS — BP 128/80 | HR 78 | Ht 64.0 in | Wt 204.6 lb

## 2023-05-07 DIAGNOSIS — M7918 Myalgia, other site: Secondary | ICD-10-CM

## 2023-05-07 NOTE — Progress Notes (Signed)
 Musculoskeletal Exam  Patient: Shelly Howell DOB: 1950/04/28  DOS: 05/07/2023  SUBJECTIVE:  Chief Complaint:   Chief Complaint  Patient presents with   Acute Visit    Patient left side pain 4 months now due to a fall.    Shelly Howell is a 73 y.o.  female for evaluation and treatment of L buttock pain.   Onset:  3 months ago. Fell and stretched out leg in unnatural position while roller skating.  Location: L outer buttock Character:  aching  Progression of issue:  is unchanged Associated symptoms: hurts while walking No bruising, redness, swelling Treatment: to date has been heat, NSAIDs, stretches.   Neurovascular symptoms: no  Past Medical History:  Diagnosis Date   Allergy    seasonal   Arthritis    back   COPD (chronic obstructive pulmonary disease) (HCC) 1998   Depression    Hypertension 2020   Migraines    Sleep apnea 2000    Objective: VITAL SIGNS: BP 128/80   Pulse 78   Ht 5\' 4"  (1.626 m)   Wt 204 lb 9.6 oz (92.8 kg)   SpO2 98%   BMI 35.12 kg/m  Constitutional: Well formed, well developed. No acute distress. Thorax & Lungs: No accessory muscle use Musculoskeletal: L hip/buttock.   Normal active range of motion: yes.   Normal passive range of motion: yes Tenderness to palpation: yes over external rotators of L hip Deformity: no Tests positive: none Tests negative: Stinchfield, FABER, FADIR Neurologic: Normal sensory function. No focal deficits noted. DTR's equal and symmetric in LE's. No clonus. Psychiatric: Normal mood. Age appropriate judgment and insight. Alert & oriented x 3.    Assessment:  Pain in left buttock  Plan: Stretches/exercises, heat, ice, Tylenol. If no better in 3-4 weeks, will set up with PT.  F/u as originally scheduled. The patient voiced understanding and agreement to the plan.   Jilda Roche [place], DO 05/07/23  10:06 AM

## 2023-05-07 NOTE — Patient Instructions (Addendum)
 Heat (pad or rice pillow in microwave) over affected area, 10-15 minutes twice daily.   Ice/cold pack over area for 10-15 min twice daily.  OK to take Tylenol 1000 mg (2 extra strength tabs) or 975 mg (3 regular strength tabs) every 6 hours as needed.  Consider taking magnesium 200-400 mg daily.   Send me a message in 3-4 weeks if not improving.   Let us know if you need anything.  Piriformis Syndrome Rehab It is normal to feel mild stretching, pulling, tightness, or discomfort as you do these exercises, but you should stop right away if you feel sudden pain or your pain gets worse.   Stretching and range of motion exercises These exercises warm up your muscles and joints and improve the movement and flexibility of your hip and pelvis. These exercises also help to relieve pain, numbness, and tingling. Exercise A: Hip rotators    Lie on your back on a firm surface. Pull your left / right knee toward your same shoulder with your left / right hand until your knee is pointing toward the ceiling. Hold your left / right ankle with your other hand. Keeping your knee steady, gently pull your left / right ankle toward your other shoulder until you feel a stretch in your buttocks. Hold this position for 30 seconds. Repeat 2 times. Complete this stretch 3 times per week. Exercise B: Hip extensors Lie on your back on a firm surface. Both of your legs should be straight. Pull your left / right knee to your chest. Hold your leg in this position by holding onto the back of your thigh or the front of your knee. Hold this position for 30 seconds. Slowly return to the starting position. Repeat 2 times. Complete this stretch 3 times per week.  Strengthening exercises These exercises build strength and endurance in your hip and thigh muscles. Endurance is the ability to use your muscles for a long time, even after they get tired. Exercise C: Straight leg raises (hip abductors)     Lie on your side  with your left / right leg in the top position. Lie so your head, shoulder, knee, and hip line up. Bend your bottom knee to help you balance. Lift your top leg up 4-6 inches (10-15 cm), keeping your toes pointed straight ahead. Hold this position for 1 second. Slowly lower your leg to the starting position. Let your muscles relax completely. Repeat for a total of 10 repetitions. Repeat 2 times. Complete this exercise 3 times per week. Exercise D: Hip abductors and rotators, quadruped    Get on your hands and knees on a firm, lightly padded surface. Your hands should be directly below your shoulders, and your knees should be directly below your hips. Lift your left / right knee out to the side. Keep your knee bent. Do not twist your body. Hold this position for 1 seconds. Slowly lower your leg. Repeat for a total of 10 repetitions.  Repeat 1 times. Complete this exercise 3 times per week. Exercise E: Straight leg raises (hip extensors) Lie on your abdomen on a bed or a firm surface with a pillow under your hips. Squeeze your buttock muscles and lift your left / right thigh off the bed. Do not let your back arch. Hold this position for 3 seconds. Slowly return to the starting position. Let your muscles relax completely before doing another repetition. Repeat 2 times. Complete this exercise 3 times per week.  This information is not  intended to replace advice given to you by your health care provider. Make sure you discuss any questions you have with your health care provider. Document Released: 01/23/2005 Document Revised: 09/28/2015 Document Reviewed: 01/05/2015 Elsevier Interactive Patient Education  [employer].

## 2023-05-29 DIAGNOSIS — R5383 Other fatigue: Secondary | ICD-10-CM | POA: Diagnosis not present

## 2023-06-08 DIAGNOSIS — E041 Nontoxic single thyroid nodule: Secondary | ICD-10-CM | POA: Diagnosis not present

## 2023-06-08 DIAGNOSIS — E210 Primary hyperparathyroidism: Secondary | ICD-10-CM | POA: Diagnosis not present

## 2023-06-18 ENCOUNTER — Ambulatory Visit
Admission: RE | Admit: 2023-06-18 | Discharge: 2023-06-18 | Disposition: A | Discharge status: Home / Self Care | Source: Ambulatory Visit | Attending: Family Medicine

## 2023-06-18 DIAGNOSIS — Z1231 Encounter for screening mammogram for malignant neoplasm of breast: Secondary | ICD-10-CM

## 2023-06-21 ENCOUNTER — Other Ambulatory Visit: Payer: Self-pay | Admitting: Family Medicine

## 2023-06-21 DIAGNOSIS — R928 Other abnormal and inconclusive findings on diagnostic imaging of breast: Secondary | ICD-10-CM

## 2023-06-23 ENCOUNTER — Ambulatory Visit
Admission: RE | Admit: 2023-06-23 | Discharge: 2023-06-23 | Disposition: A | Discharge status: Home / Self Care | Source: Ambulatory Visit | Attending: Family Medicine

## 2023-06-23 DIAGNOSIS — R928 Other abnormal and inconclusive findings on diagnostic imaging of breast: Secondary | ICD-10-CM

## 2023-06-23 DIAGNOSIS — R920 Mammographic microcalcification found on diagnostic imaging of breast: Secondary | ICD-10-CM | POA: Diagnosis not present

## 2023-06-25 ENCOUNTER — Other Ambulatory Visit: Payer: Self-pay | Admitting: Family Medicine

## 2023-06-25 DIAGNOSIS — R921 Mammographic calcification found on diagnostic imaging of breast: Secondary | ICD-10-CM

## 2023-08-03 ENCOUNTER — Other Ambulatory Visit: Payer: Self-pay | Admitting: Family Medicine

## 2023-08-03 DIAGNOSIS — I10 Essential (primary) hypertension: Secondary | ICD-10-CM

## 2023-08-16 DIAGNOSIS — E210 Primary hyperparathyroidism: Secondary | ICD-10-CM | POA: Diagnosis not present

## 2023-09-17 DIAGNOSIS — D351 Benign neoplasm of parathyroid gland: Secondary | ICD-10-CM | POA: Diagnosis not present

## 2023-09-17 DIAGNOSIS — E210 Primary hyperparathyroidism: Secondary | ICD-10-CM | POA: Diagnosis not present

## 2023-09-18 DIAGNOSIS — I447 Left bundle-branch block, unspecified: Secondary | ICD-10-CM | POA: Diagnosis not present

## 2023-09-18 DIAGNOSIS — I440 Atrioventricular block, first degree: Secondary | ICD-10-CM | POA: Diagnosis not present

## 2023-09-18 DIAGNOSIS — I499 Cardiac arrhythmia, unspecified: Secondary | ICD-10-CM | POA: Diagnosis not present

## 2023-10-02 DIAGNOSIS — I440 Atrioventricular block, first degree: Secondary | ICD-10-CM | POA: Diagnosis not present

## 2023-10-02 DIAGNOSIS — I10 Essential (primary) hypertension: Secondary | ICD-10-CM | POA: Diagnosis not present

## 2023-10-02 DIAGNOSIS — I447 Left bundle-branch block, unspecified: Secondary | ICD-10-CM | POA: Diagnosis not present

## 2023-10-02 DIAGNOSIS — R002 Palpitations: Secondary | ICD-10-CM | POA: Diagnosis not present

## 2023-10-04 DIAGNOSIS — E210 Primary hyperparathyroidism: Secondary | ICD-10-CM | POA: Diagnosis not present

## 2023-10-25 DIAGNOSIS — I447 Left bundle-branch block, unspecified: Secondary | ICD-10-CM | POA: Diagnosis not present

## 2023-11-07 ENCOUNTER — Ambulatory Visit

## 2023-12-26 ENCOUNTER — Ambulatory Visit

## 2023-12-27 ENCOUNTER — Ambulatory Visit: Admitting: *Deleted

## 2023-12-27 ENCOUNTER — Telehealth: Payer: Self-pay | Admitting: *Deleted

## 2023-12-27 VITALS — BP 126/61 | HR 79 | Temp 98.1°F | Resp 18 | Ht 64.0 in | Wt 220.6 lb

## 2023-12-27 DIAGNOSIS — Z0000 Encounter for general adult medical examination without abnormal findings: Secondary | ICD-10-CM

## 2023-12-27 NOTE — Progress Notes (Signed)
 Please attest this visit in the absence of patient primary care provider.    Chief Complaint  Patient presents with   Medicare Wellness     Subjective:   Shelly Howell is a 73 y.o. female who presents for a Medicare Annual Wellness Visit.  Allergies (verified) Patient has no known allergies.   History: Past Medical History:  Diagnosis Date   Allergy    seasonal   Arthritis    back   COPD (chronic obstructive pulmonary disease) (HCC) 1998   Depression    Hypertension 2020   Migraines    Sleep apnea 2000   Past Surgical History:  Procedure Laterality Date   ABDOMINAL HYSTERECTOMY     APPENDECTOMY     DILATION AND CURETTAGE OF UTERUS     OVARY SURGERY  06/1973   cyst on left ovary ruptured/ovary was removed   Family History  Problem Relation Age of Onset   Cancer Mother    Skin cancer Mother    Alcohol abuse Father    COPD Father    Diabetes Father    Cancer Sister    Drug abuse Sister    Heart disease Sister    Obesity Sister    Cancer Brother    Lung cancer Brother    Stroke Maternal Grandmother    Cancer Maternal Grandfather    Healthy Sister    Alcohol abuse Daughter    Social History   Occupational History   Not on file  Tobacco Use   Smoking status: Former    Current packs/day: 0.00    Types: Cigarettes    Quit date: 10/18/1980    Years since quitting: 43.2   Smokeless tobacco: Never  Substance and Sexual Activity   Alcohol use: Not Currently   Drug use: Never   Sexual activity: Yes    Birth control/protection: Post-menopausal   Tobacco Counseling Counseling given: Not Answered  SDOH Screenings   Food Insecurity: No Food Insecurity (12/24/2023)  Housing: Low Risk  (12/24/2023)  Transportation Needs: No Transportation Needs (12/24/2023)  Utilities: Not At Risk (12/27/2023)  Alcohol Screen: Low Risk  (12/24/2023)  Depression (PHQ2-9): High Risk (12/27/2023)  Financial Resource Strain: Low Risk  (12/24/2023)  Physical Activity:  Insufficiently Active (12/24/2023)  Social Connections: Moderately Isolated (12/24/2023)  Stress: No Stress Concern Present (12/24/2023)  Tobacco Use: Medium Risk (12/27/2023)  Health Literacy: Adequate Health Literacy (11/06/2022)   See flowsheets for full screening details  Depression Screen PHQ 2 & 9 Depression Scale- Over the past 2 weeks, how often have you been bothered by any of the following problems? Little interest or pleasure in doing things: 3 (Has not been taking medicine. Will restart. Spouse is sick, pt hasn't been leaving the house. Had good day Tuesday but doesn't have a lot of good days) Feeling down, depressed, or hopeless (PHQ Adolescent also includes...irritable): 3 PHQ-2 Total Score: 6 Trouble falling or staying asleep, or sleeping too much: 3 (sleeps 9 hrs at night and 2 hour nap during the day.) Feeling tired or having little energy: 3 Poor appetite or overeating (PHQ Adolescent also includes...weight loss): 3 (over eating) Feeling bad about yourself - or that you are a failure or have let yourself or your family down: 0 Trouble concentrating on things, such as reading the newspaper or watching television (PHQ Adolescent also includes...like school work): 0 Moving or speaking so slowly that other people could have noticed. Or the opposite - being so fidgety or restless that you have been moving  around a lot more than usual: 0 Thoughts that you would be better off dead, or of hurting yourself in some way: 0 PHQ-9 Total Score: 15 If you checked off any problems, how difficult have these problems made it for you to do your work, take care of things at home, or get along with other people?: Not difficult at all (does what she needs to do.)  Depression Treatment Depression Interventions/Treatment : Currently on Treatment (has medication and will restart)     Goals Addressed             This Visit's Progress    Patient Stated   Not on track    Would like to lose  some weight , increase exercise & drink more water       Visit info / Clinical Intake: Medicare Wellness Visit Type:: Subsequent Annual Wellness Visit Persons participating in visit:: patient Medicare Wellness Visit Mode:: In-person (required for WTM) Information given by:: patient Interpreter Needed?: No Pre-visit prep was completed: yes AWV questionnaire completed by patient prior to visit?: yes Date:: 12/24/23 Living arrangements:: lives with spouse/significant other Patient's Overall Health Status Rating: good Typical amount of pain: some Does pain affect daily life?: (!) yes (can't exercise like used to. Back/knee pain) Are you currently prescribed opioids?: no  Dietary Habits and Nutritional Risks How many meals a day?: 3 Eats fruit and vegetables daily?: (!) no Most meals are obtained by: preparing own meals In the last 2 weeks, have you had any of the following?: unintentional weight gain (Had sudden diarrhea yesterday after eating some chicken from a deli, Has had weight gain the last couple of months) Diabetic:: no  Functional Status Activities of Daily Living (to include ambulation/medication): (Patient-Rptd) Independent Ambulation: (Patient-Rptd) Independent Medication Administration: Independent Home Management: (Patient-Rptd) Independent Manage your own finances?: yes Primary transportation is: driving Concerns about vision?: no *vision screening is required for WTM* (Past due with [facility] and looking for new doctor, list provided) Concerns about hearing?: (!) yes (has difficulty with TV or background noise. Hearing test was normal at [facility]) Uses hearing aids?: no  Fall Screening Falls in the past year?: (Patient-Rptd) 0 Number of falls in past year: (Patient-Rptd) 0 Was there an injury with Fall?: (Patient-Rptd) 0 Fall Risk Category Calculator: (Patient-Rptd) 0 Patient Fall Risk Level: (Patient-Rptd) Low Fall Risk  Fall Risk Patient at Risk for  Falls Due to: No Fall Risks Fall risk Follow up: Falls evaluation completed  Home and Transportation Safety: All rugs have non-skid backing?: (!) no All stairs or steps have railings?: yes (6 steps into home) Grab bars in the bathtub or shower?: (!) no Have non-skid surface in bathtub or shower?: yes Good home lighting?: yes Regular seat belt use?: yes Hospital stays in the last year:: no  Cognitive Assessment Difficulty concentrating, remembering, or making decisions? : yes Will 6CIT or Mini Cog be Completed: yes What year is it?: 0 points What month is it?: 0 points Give patient an address phrase to remember (5 components): [address], [place]  About what time is it?: 0 points Count backwards from 20 to 1: 0 points Say the months of the year in reverse: 0 points Repeat the address phrase from earlier: 0 points 6 CIT Score: 0 points  Advance Directives (For Healthcare) Does Patient Have a Medical Advance Directive?: No Would patient like information on creating a medical advance directive?: Yes (MAU/Ambulatory/Procedural Areas - Information given)  Reviewed/Updated  Reviewed/Updated: Reviewed All (Medical, Surgical, Family,  Medications, Allergies, Care Teams, Patient Goals)        Objective:    Today's Vitals   12/27/23 0928  BP: 126/61  Pulse: 79  Resp: 18  Temp: 98.1 F (36.7 C)  TempSrc: Oral  SpO2: 98%  Weight: 220 lb 9.6 oz (100.1 kg)  Height: 5' 4 (1.626 m)   Body mass index is 37.87 kg/m.  Current Medications (verified) Outpatient Encounter Medications as of 12/27/2023  Medication Sig   amLODipine  (NORVASC ) 5 MG tablet TAKE 1 TABLET(5 MG) BY MOUTH DAILY (Patient not taking: Reported on 12/27/2023)   Cholecalciferol (VITAMIN D) 2000 units CAPS Take by mouth. (Patient not taking: Reported on 12/27/2023)   FLUoxetine  (PROZAC ) 40 MG capsule TAKE 1 CAPSULE(40 MG) BY MOUTH DAILY (Patient not taking: Reported on 12/27/2023)   glucosamine-chondroitin  500-400 MG tablet Take 1 tablet by mouth 3 (three) times daily. (Patient not taking: Reported on 12/27/2023)   Multiple Vitamin (MULTIVITAMIN) tablet Take 1 tablet by mouth daily. (Patient not taking: Reported on 12/27/2023)   Omega-3 Fatty Acids (FISH OIL PO) Take by mouth. (Patient not taking: Reported on 12/27/2023)   No facility-administered encounter medications on file as of 12/27/2023.   Hearing/Vision screen No results found. Immunizations and Health Maintenance Health Maintenance  Topic Date Due   Influenza Vaccine  05/06/2024 (Originally 09/07/2023)   COVID-19 Vaccine (4 - 2025-26 season) 12/26/2024 (Originally 10/08/2023)   Medicare Annual Wellness (AWV)  12/26/2024   Mammogram  06/22/2025   Colonoscopy  11/27/2027   DTaP/Tdap/Td (2 - Td or Tdap) 10/20/2029   Pneumococcal Vaccine: 50+ Years  Completed   Bone Density Scan  Completed   Hepatitis C Screening  Completed   Zoster Vaccines- Shingrix   Completed   Meningococcal B Vaccine  Aged Out   Declines flu and Covid vaccine. Already  has mammogram scheduled at [facility] for December.     Assessment/Plan:  This is a routine wellness examination for Shelly Howell.  Patient Care Team: Frann Mabel Mt, DO as PCP - General (Family Medicine) Jannis Ilah Mines, MD as Referring Physician (General Surgery) Trudy Elodia JINNY DEVONNA (Endocrinology) Arne Feast, MD as Referring Physician (Cardiology)  I have personally reviewed and noted the following in the patient's chart:   Medical and social history Use of alcohol, tobacco or illicit drugs  Current medications and supplements including opioid prescriptions. Functional ability and status Nutritional status Physical activity Advanced directives List of other physicians Hospitalizations, surgeries, and ER visits in previous 12 months Vitals Screenings to include cognitive, depression, and falls Referrals and appointments  No orders of the defined  types were placed in this encounter.  In addition, I have reviewed and discussed with patient certain preventive protocols, quality metrics, and best practice recommendations. A written personalized care plan for preventive services as well as general preventive health recommendations were provided to patient.   Lolita Libra, CMA   12/27/2023   Return in 1 year (on 12/26/2024).  After Visit Summary: (In Person-Printed) AVS printed and given to the patient  Nurse Notes: see phone note

## 2023-12-27 NOTE — Telephone Encounter (Signed)
 Pt had AWV today. States she has not been taking any medication in the last 2 weeks.  Blood pressure was good in office today and patient states it has not been that low at home. Reported systolic reading in the 150s. States she just forgets to take her medication.   She has gained 16 pounds in the last couple of months. Exercise has been prohibited by back/knee pain. Pt has been depressed and doesn't want to do anything. Is overeating, sleeping a lot. Scored 15 on PHQ9 today. She says she will restart her medication. Declines counseling at this time and states she will reach out if she changes her mind. She has scheduled a 6 month follow up visit for 01/07/24 w/PCP.

## 2023-12-27 NOTE — Patient Instructions (Addendum)
 [name],  Thank you for taking the time for your Medicare Wellness Visit. I appreciate your continued commitment to your health goals. Please review the care plan we discussed, and feel free to reach out if I can assist you further.  Please note that Annual Wellness Visits do not include a physical exam. Some assessments may be limited, especially if the visit was conducted virtually. If needed, we may recommend an in-person follow-up with your provider.  Ongoing Care Seeing your primary care provider every 3 to 6 months helps us  monitor your health and provide consistent, personalized care.   Dr Frann:  01/07/24 1pm Annual Wellness Visit: 12/26/24 9:40am in person  Referrals If a referral was made during today's visit and you haven't received any updates within two weeks, please contact the referred provider directly to check on the status.  See list of local eye doctors provided today  [facility]   Recommended Screenings: You will need to get the following vaccines at your local pharmacy: Flu  Health Maintenance  Topic Date Due   Medicare Annual Wellness Visit  11/06/2023   Flu Shot  05/06/2024*   COVID-19 Vaccine (4 - 2025-26 season) 12/26/2024*   Breast Cancer Screening  06/22/2025   Colon Cancer Screening  11/27/2027   DTaP/Tdap/Td vaccine (2 - Td or Tdap) 10/20/2029   Pneumococcal Vaccine for age over 49  Completed   Osteoporosis screening with Bone Density Scan  Completed   Hepatitis C Screening  Completed   Zoster (Shingles) Vaccine  Completed   Meningitis B Vaccine  Aged Out  *Topic was postponed. The date shown is not the original due date.       11/06/2022    3:12 PM  Advanced Directives  Does Patient Have a Medical Advance Directive? No  Would patient like information on creating a medical advance directive? No - Patient declined    Vision: Annual vision screenings are recommended for early detection of  glaucoma, cataracts, and diabetic retinopathy. These exams can also reveal signs of chronic conditions such as diabetes and high blood pressure.  Dental: Annual dental screenings help detect early signs of oral cancer, gum disease, and other conditions linked to overall health, including heart disease and diabetes.  Please see the attached documents for additional preventive care recommendations.

## 2024-01-07 ENCOUNTER — Ambulatory Visit: Admitting: Family Medicine

## 2024-01-07 ENCOUNTER — Encounter: Payer: Self-pay | Admitting: Family Medicine

## 2024-01-07 VITALS — BP 140/84 | HR 78 | Temp 98.0°F | Resp 16 | Ht 64.0 in | Wt 222.8 lb

## 2024-01-07 DIAGNOSIS — F339 Major depressive disorder, recurrent, unspecified: Secondary | ICD-10-CM | POA: Diagnosis not present

## 2024-01-07 DIAGNOSIS — R1319 Other dysphagia: Secondary | ICD-10-CM | POA: Diagnosis not present

## 2024-01-07 DIAGNOSIS — I10 Essential (primary) hypertension: Secondary | ICD-10-CM | POA: Diagnosis not present

## 2024-01-07 MED ORDER — FLUOXETINE HCL 40 MG PO CAPS: 40.0000 mg | ORAL_CAPSULE | Freq: Every day | ORAL | Status: AC

## 2024-01-07 MED ORDER — OLMESARTAN MEDOXOMIL 20 MG PO TABS: 20.0000 mg | ORAL_TABLET | Freq: Every day | ORAL | 2 refills | Status: AC

## 2024-01-07 NOTE — Progress Notes (Signed)
 Chief Complaint  Patient presents with   Follow-up    Follow Up    Subjective Shelly Howell is a 73 y.o. female who presents for hypertension follow up. She does monitor home blood pressures. Blood pressures ranging from 140's/80's on average. She is not currently on any medications. She was on Norvasc  5 mg/d in the past.  She is not alwaysadhering to a healthy diet overall. Current exercise: limited No CP or SOB.   Depression Struggling with depression again. She was on Prozac  in the past but stopped taking it. She is not seeing a therapist/counselor. No HI or SI. No self-medication.    Past Medical History:  Diagnosis Date   Allergy    seasonal   Arthritis    back   COPD (chronic obstructive pulmonary disease) (HCC) 1998   Depression    Hypertension 2020   Migraines    Sleep apnea 2000    Exam BP (!) 140/84 (BP Location: Left Arm, Cuff Size: Large)   Pulse 78   Temp 98 F (36.7 C) (Oral)   Resp 16   Ht 5' 4 (1.626 m)   Wt 222 lb 12.8 oz (101.1 kg)   SpO2 98%   BMI 38.24 kg/m  General:  well developed, well nourished, in no apparent distress Heart: RRR, no bruits, no LE edema Lungs: clear to auscultation, no accessory muscle use Psych: well oriented with normal range of affect and appropriate judgment/insight  Essential hypertension - Plan: olmesartan  (BENICAR ) 20 MG tablet, Basic metabolic panel with GFR  Depression, recurrent - Plan: FLUoxetine  (PROZAC ) 40 MG capsule  Esophageal dysphagia  Chronic, unstable. Start olmesartan  20 mg/d. F/u BMP in 1 week.  Counseled on diet and exercise. Chronic, unstable. Restart Prozac  ramping up to 40 mg/d.  She reports that this is steadily getting better.  Offered referral to gastroenterology which she declined at this time.  Will reassess in 6 weeks. F/u in 6 weeks. The patient voiced understanding and agreement to the plan.  Shelly Howell Mt Echo, DO 01/07/24  1:53 PM

## 2024-01-07 NOTE — Patient Instructions (Addendum)
 Keep the diet clean and stay active.  Aim to do some physical exertion for 150 minutes per week. This is typically divided into 5 days per week, 30 minutes per day. The activity should be enough to get your heart rate up. Anything is better than nothing if you have time constraints.  Let us know if you need anything.

## 2024-01-08 ENCOUNTER — Ambulatory Visit
Admission: RE | Admit: 2024-01-08 | Discharge: 2024-01-08 | Disposition: A | Source: Ambulatory Visit | Attending: Family Medicine

## 2024-01-08 DIAGNOSIS — R928 Other abnormal and inconclusive findings on diagnostic imaging of breast: Secondary | ICD-10-CM | POA: Diagnosis not present

## 2024-01-08 DIAGNOSIS — R921 Mammographic calcification found on diagnostic imaging of breast: Secondary | ICD-10-CM

## 2024-01-09 ENCOUNTER — Telehealth: Payer: Self-pay | Admitting: *Deleted

## 2024-01-09 NOTE — Telephone Encounter (Signed)
 Pt is scheduled for lab appointment for 01/07/24 but there are no orders.

## 2024-01-14 ENCOUNTER — Other Ambulatory Visit

## 2024-02-18 ENCOUNTER — Ambulatory Visit: Admitting: Family Medicine

## 2024-12-30 ENCOUNTER — Ambulatory Visit
